# Patient Record
Sex: Female | Born: 2000 | Race: Black or African American | Hispanic: No | Marital: Single | Smoking: Never smoker
Health system: Southern US, Community
[De-identification: ages and names within clinical notes are randomized; demographics above are authoritative.]

## PROBLEM LIST (undated history)

## (undated) ENCOUNTER — Inpatient Hospital Stay (HOSPITAL_COMMUNITY): Payer: Self-pay

## (undated) DIAGNOSIS — J45909 Unspecified asthma, uncomplicated: Secondary | ICD-10-CM

## (undated) DIAGNOSIS — E119 Type 2 diabetes mellitus without complications: Secondary | ICD-10-CM

## (undated) DIAGNOSIS — N83209 Unspecified ovarian cyst, unspecified side: Secondary | ICD-10-CM

## (undated) DIAGNOSIS — E039 Hypothyroidism, unspecified: Secondary | ICD-10-CM

## (undated) HISTORY — PX: CYSTECTOMY: SHX5119

## (undated) HISTORY — PX: APPENDECTOMY: SHX54

## (undated) HISTORY — DX: Unspecified asthma, uncomplicated: J45.909

---
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
## 2020-02-21 ENCOUNTER — Ambulatory Visit: Payer: Medicaid Other | Admitting: Women's Health

## 2020-03-20 ENCOUNTER — Ambulatory Visit: Payer: Medicaid Other | Admitting: Women's Health

## 2020-04-22 ENCOUNTER — Ambulatory Visit: Payer: Medicaid Other | Admitting: Advanced Practice Midwife

## 2020-09-29 IMAGING — CT CT ABD-PELV W/ CM
2 of 4 series · 16 of 46 positions shown, 18 images · IV contrast (omnipaque)
Comparison: CT abdomen pelvis dated 01/10/2016

CLINICAL DATA: Abdominal pain

EXAM:
CT ABDOMEN AND PELVIS WITH CONTRAST
TECHNIQUE: Multidetector CT imaging of the abdomen and pelvis was performed
using the standard protocol following bolus administration of
intravenous contrast.
CONTRAST:  100mL OMNIPAQUE IOHEXOL 300 MG/ML  SOLN

[Series 3: abdomen 5.0 · axial · 0.85mm/px · z∈[+460,+900]mm · 13 of 100 slices shown, 15 images]
[im 6/100  soft-tissue]
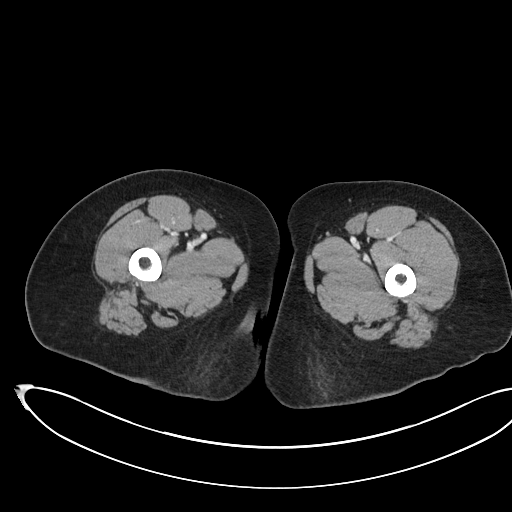
[im 6/100  bone]
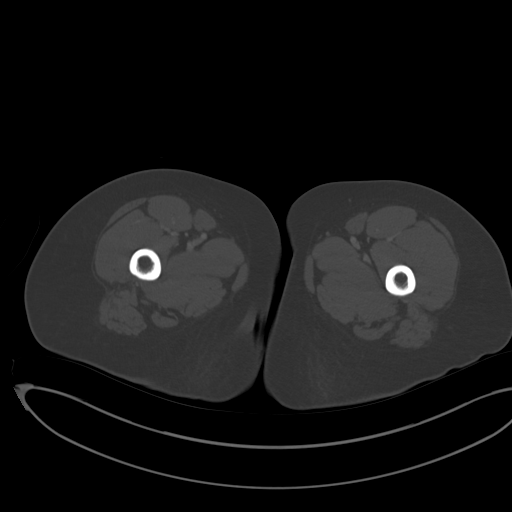
[im 16/100  soft-tissue]
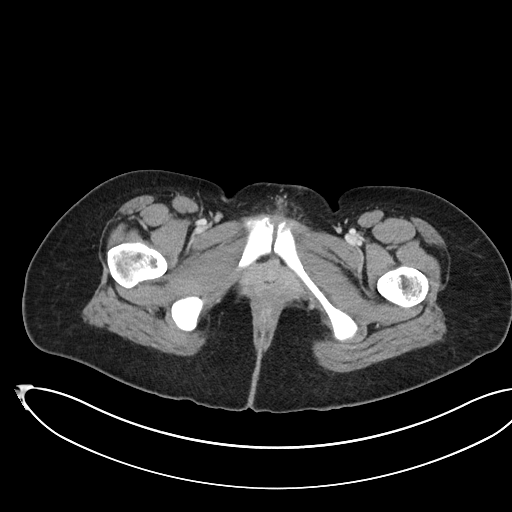
[im 21/100  soft-tissue]
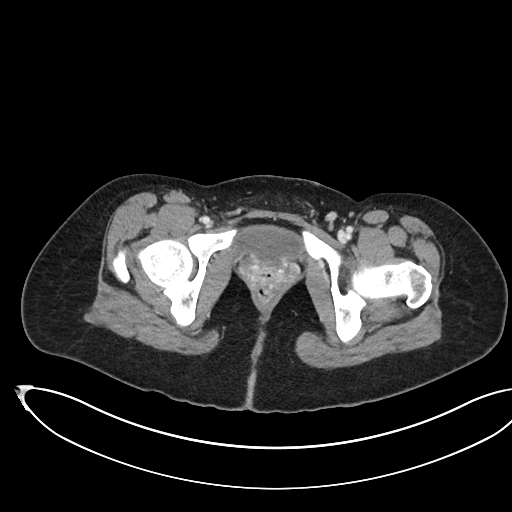
[im 27/100  soft-tissue]
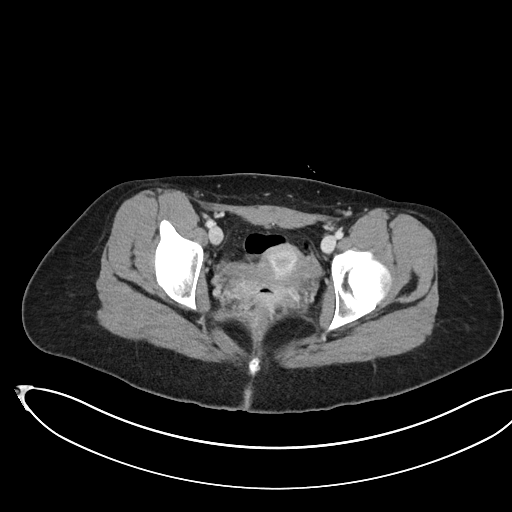
[im 37/100  soft-tissue]
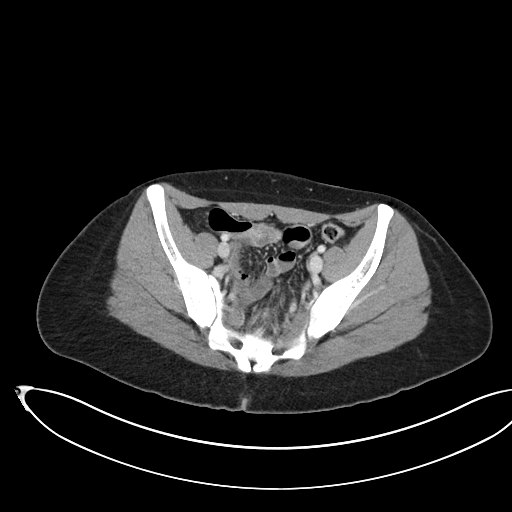
[im 42/100  soft-tissue]
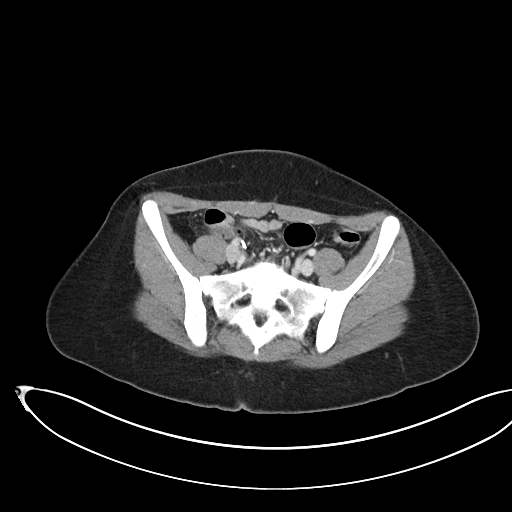
[im 53/100  soft-tissue]
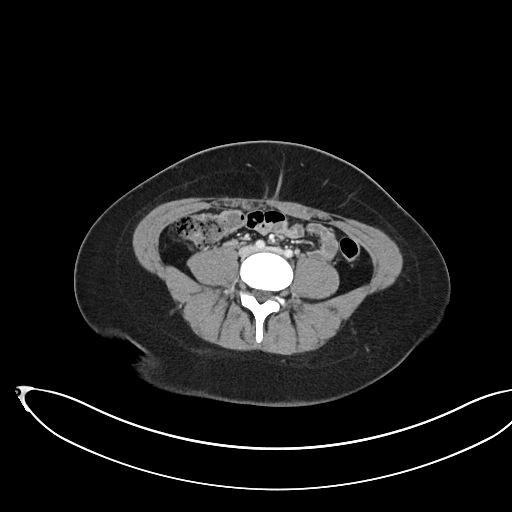
[im 58/100  soft-tissue]
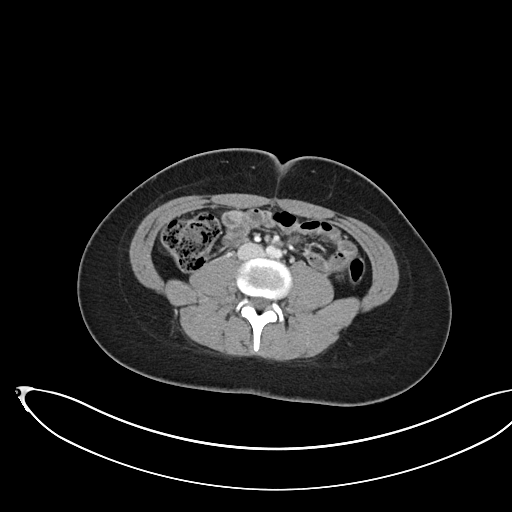
[im 63/100  soft-tissue]
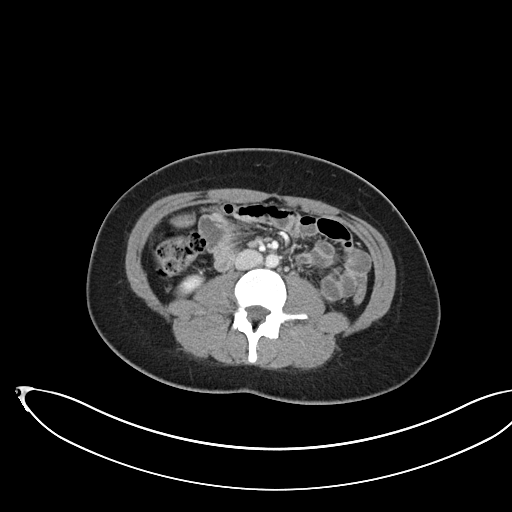
[im 63/100  bone]
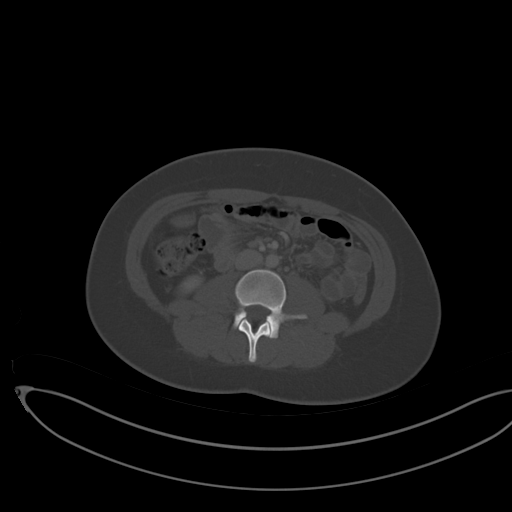
[im 73/100  soft-tissue]
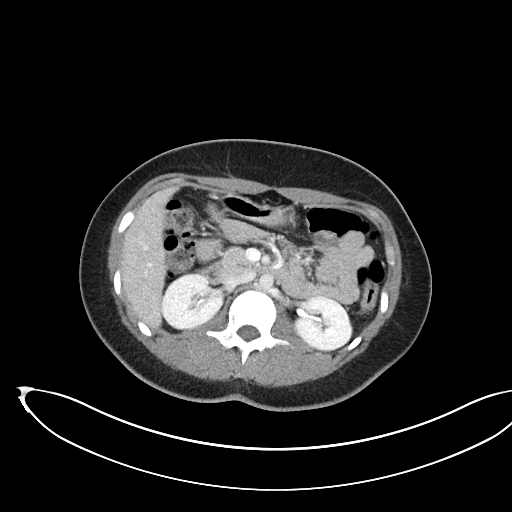
[im 79/100  soft-tissue]
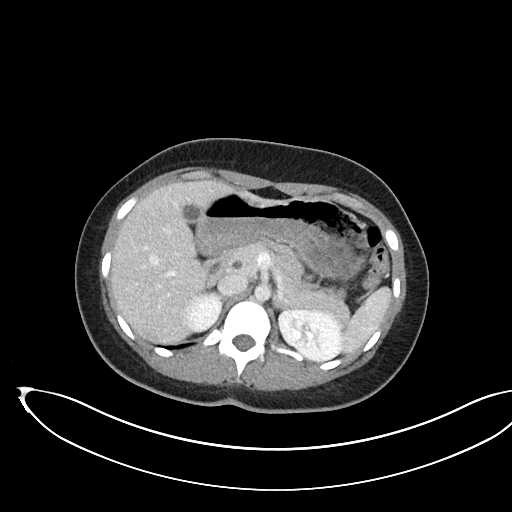
[im 84/100  soft-tissue]
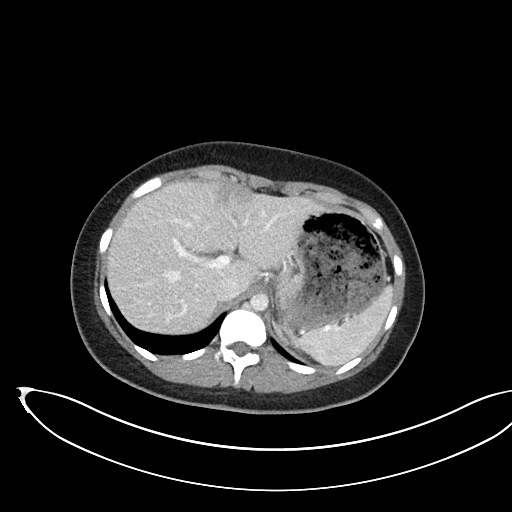
[im 94/100  soft-tissue]
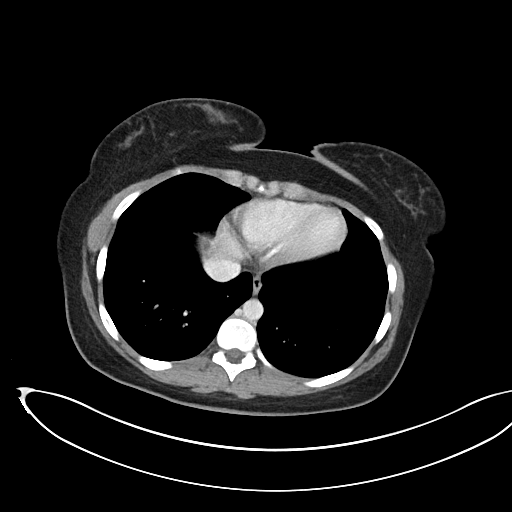

[Series 6: abdomen 3.0 mpr cor · coronal · 0.82mm/px · 3 of 91 slices shown]
[im 41/91  soft-tissue]
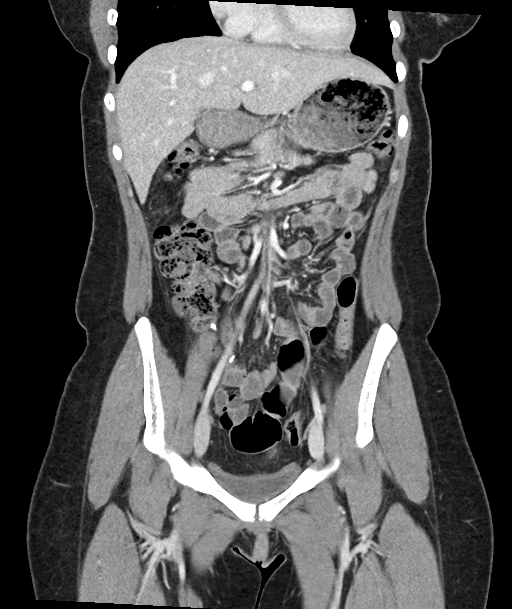
[im 51/91  soft-tissue]
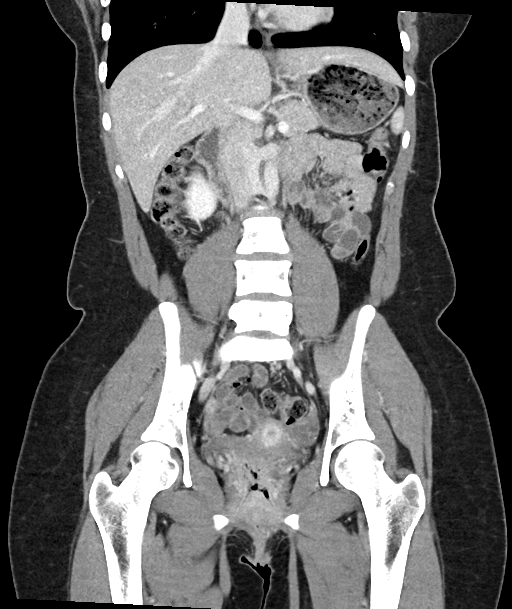
[im 61/91  soft-tissue]
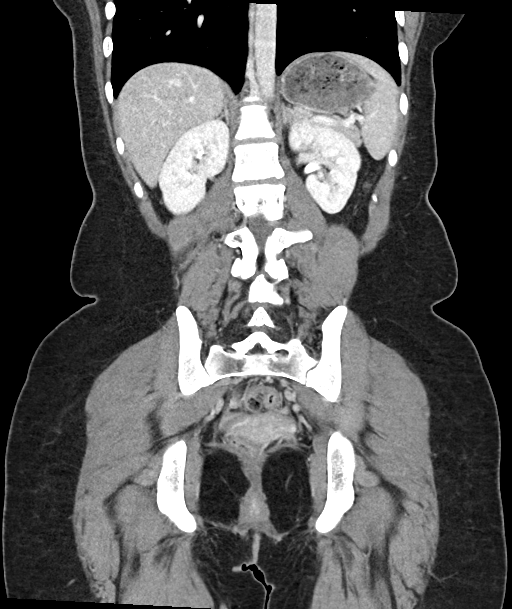

[16 of 46 positions shown; findings below may reference images not displayed]

FINDINGS: Lower chest: No acute abnormality.

Hepatobiliary: Geographic hypoattenuation adjacent to the falciform
ligament likely represents focal fatty infiltration. No gallstones,
gallbladder wall thickening, or biliary dilatation.

Pancreas: Unremarkable. No pancreatic ductal dilatation or
surrounding inflammatory changes.

Spleen: Normal in size without focal abnormality.

Adrenals/Urinary Tract: Adrenal glands are unremarkable. Kidneys are
normal, without renal calculi, focal lesion, or hydronephrosis.
Bladder is incompletely distended.

Stomach/Bowel: Stomach is within normal limits. The appendix is
surgically absent. No evidence of bowel wall thickening, distention,
or inflammatory changes.

Vascular/Lymphatic: A retroaortic left renal vein is noted. No
enlarged abdominal or pelvic lymph nodes.

Reproductive: Uterus and bilateral adnexa are unremarkable.

Other: No abdominal wall hernia or abnormality. No abdominopelvic
ascites.

Musculoskeletal: There is 3 mm retrolisthesis of L5 on S1, not
significantly changed.
IMPRESSION: No acute findings in the abdomen or pelvis.

## 2020-09-29 IMAGING — US US PELVIS COMPLETE TRANSABD/TRANSVAG W DUPLEX
1 series · 14 of 25 positions shown · non-contrast
Comparison: None

CLINICAL DATA: Pelvic pain and abdominal pain



[Series 1: us pelvic complete w transvaginal and torsion righ · 14 of 42 slices shown]
[im 1/42]
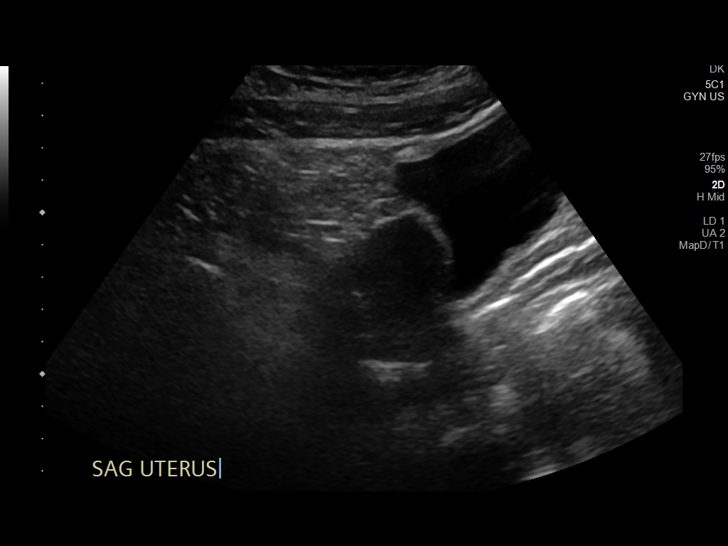
[im 4/42]
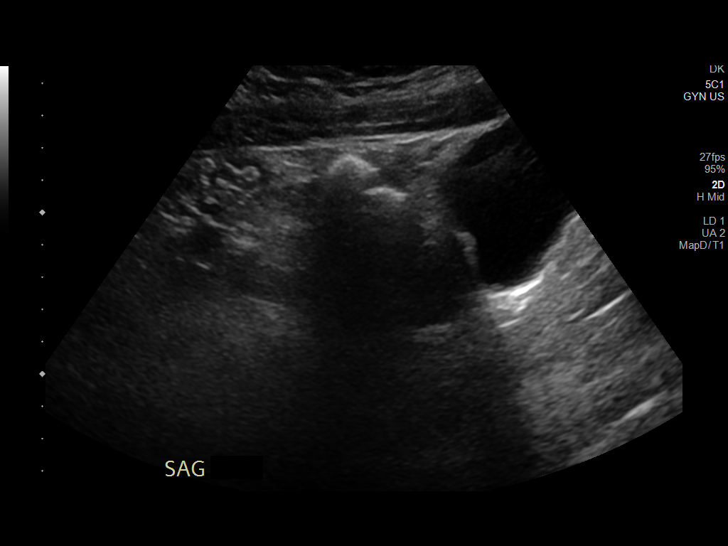
[im 7/42]
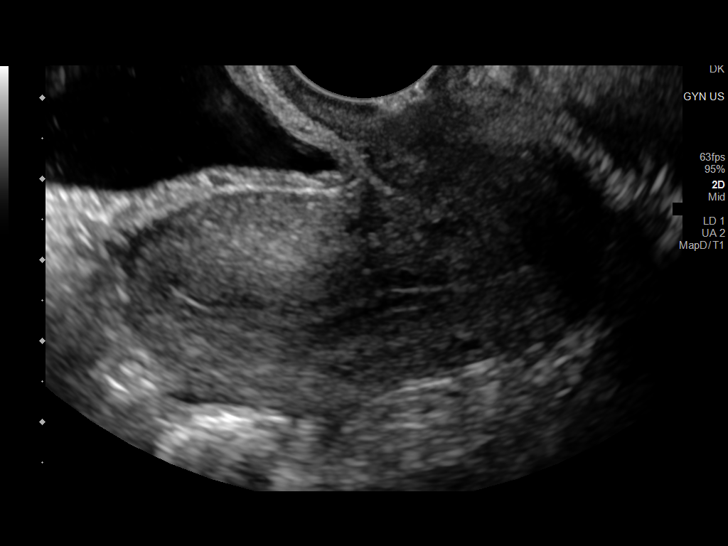
[im 11/42]
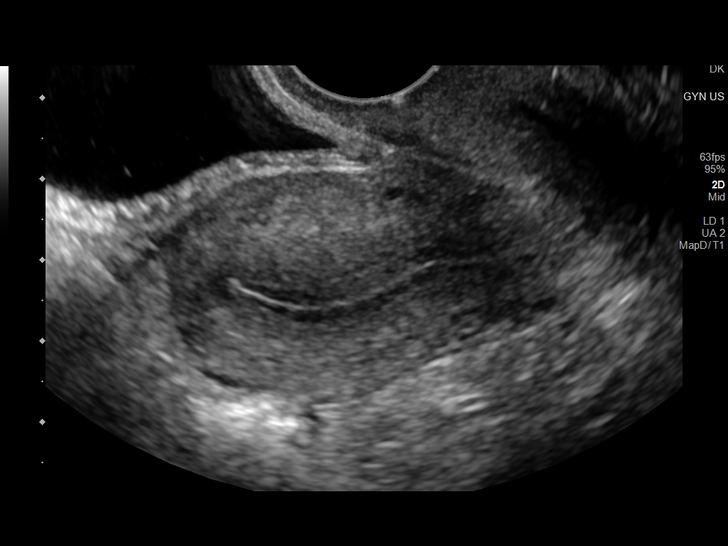
[im 14/42]
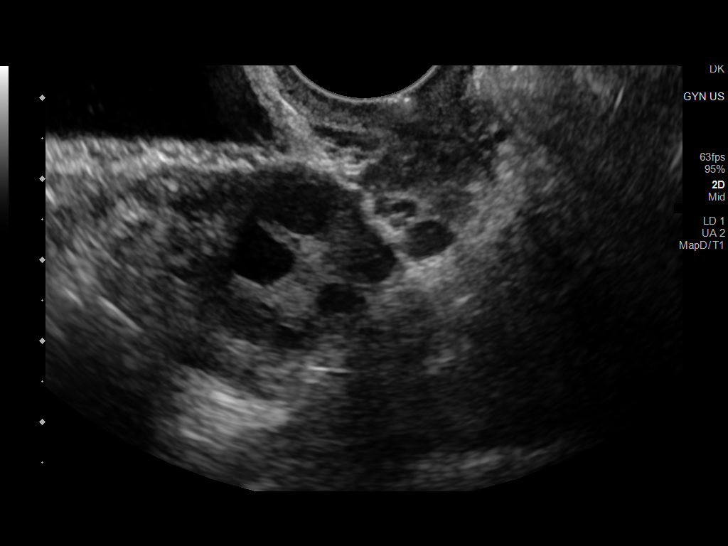
[im 16/42]
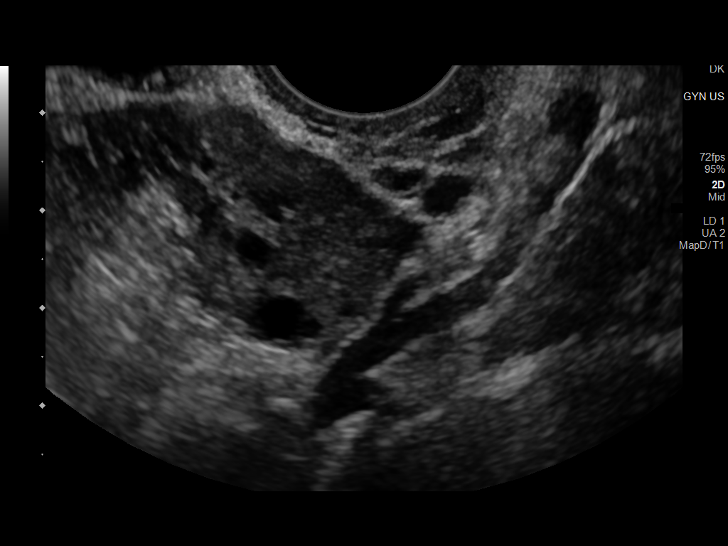
[im 19/42]
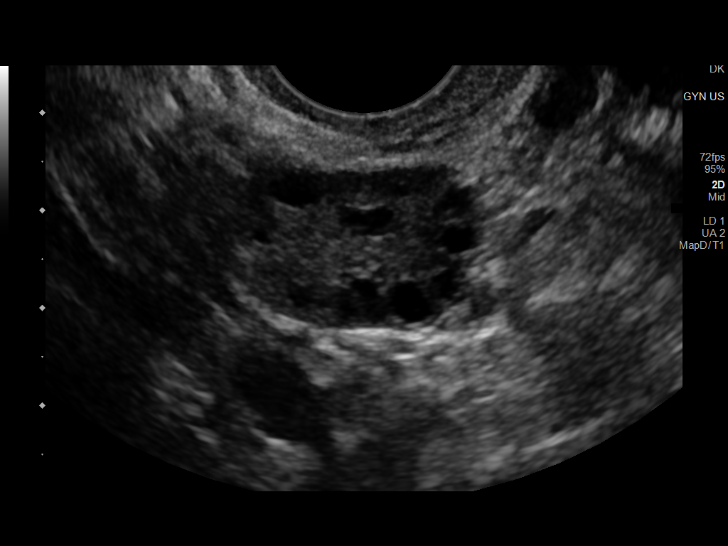
[im 23/42]
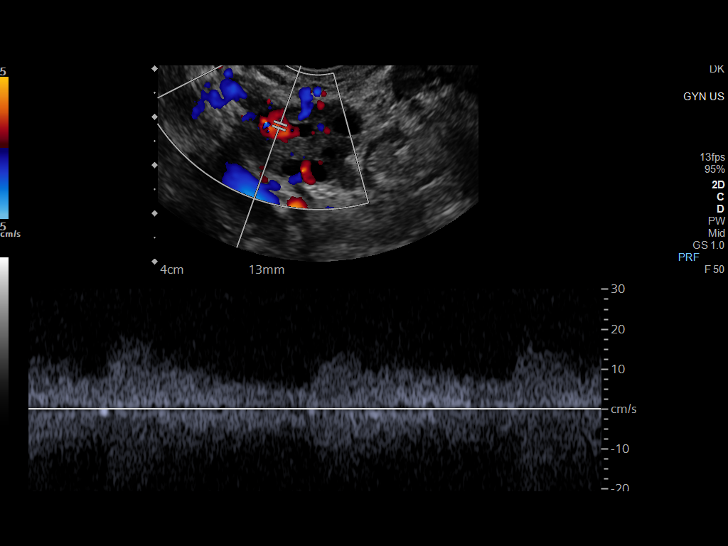
[im 26/42]
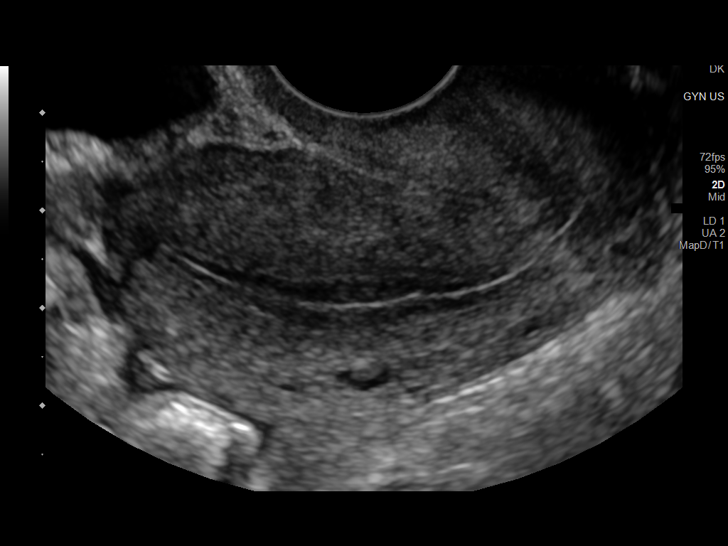
[im 28/42]
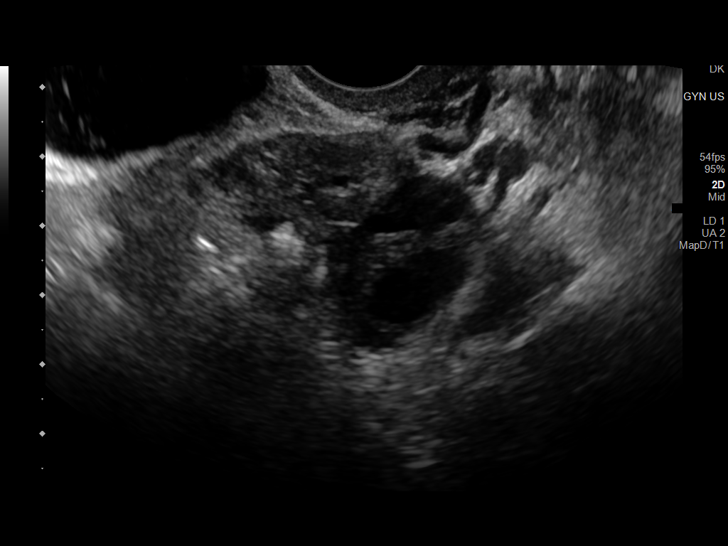
[im 31/42]
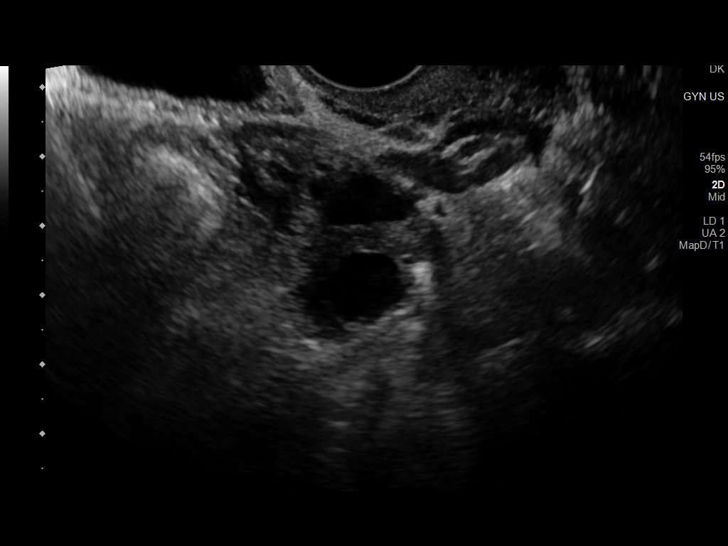
[im 35/42]
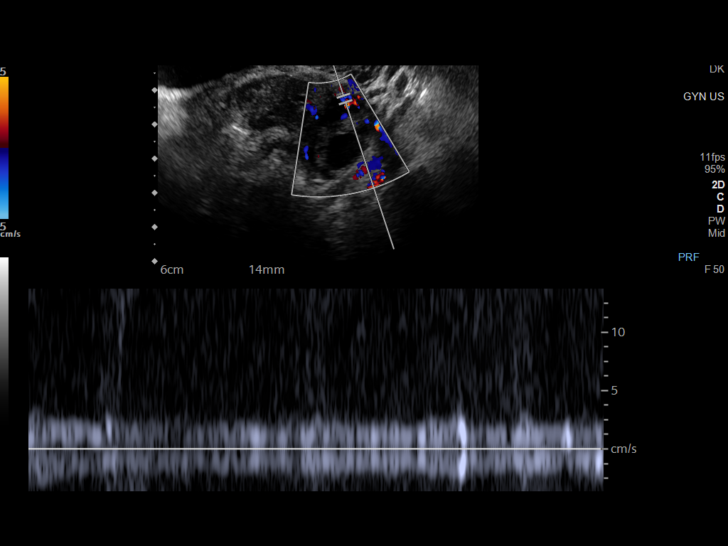
[im 38/42]
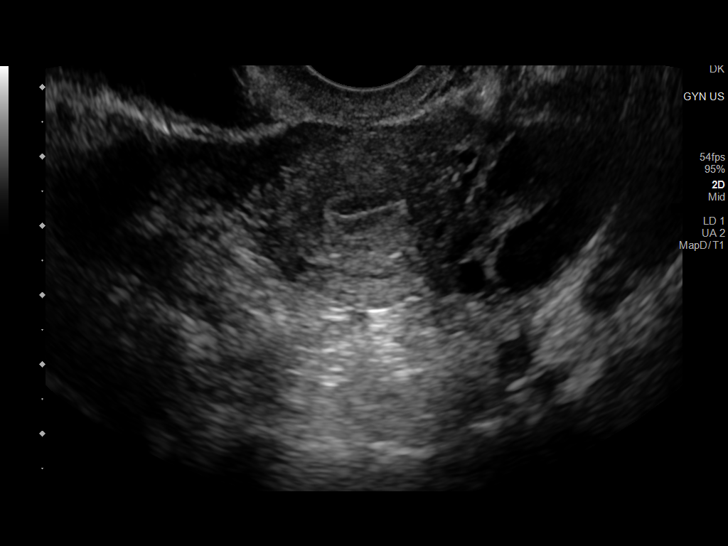
[im 42/42]
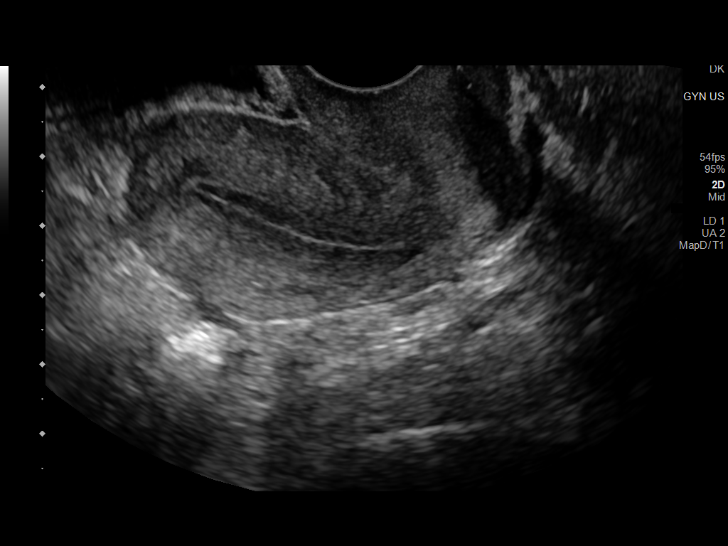

[14 of 25 positions shown; findings below may reference images not displayed]

FINDINGS: Uterus

Measurements: 6.1 x 2.8 x 3.3 cm = volume: 28 mL. No fibroids or
other mass visualized.

Endometrium

Thickness: 5.4 mm.  No focal abnormality visualized.

Right ovary

Measurements: 2.5 x 2.3 x 2.2 cm = volume: 6.45 mL. Normal
appearance/no adnexal mass.

Left ovary

Measurements: 2.8 x 2.1 x 1.7 cm = volume: 5.1 mL. Normal
appearance/no adnexal mass.

Other findings

No abnormal free fluid.
IMPRESSION: Normal appearing uterus and ovaries

## 2021-09-07 ENCOUNTER — Emergency Department (HOSPITAL_COMMUNITY)
Admission: EM | Admit: 2021-09-07 | Discharge: 2021-09-08 | Disposition: A | Discharge status: Home / Self Care | Payer: Medicaid Other | Attending: Emergency Medicine | Admitting: Emergency Medicine

## 2021-09-07 ENCOUNTER — Encounter (HOSPITAL_COMMUNITY): Payer: Self-pay

## 2021-09-07 ENCOUNTER — Other Ambulatory Visit: Payer: Self-pay

## 2021-09-07 DIAGNOSIS — E876 Hypokalemia: Secondary | ICD-10-CM | POA: Insufficient documentation

## 2021-09-07 DIAGNOSIS — Z8616 Personal history of COVID-19: Secondary | ICD-10-CM | POA: Insufficient documentation

## 2021-09-07 DIAGNOSIS — D72829 Elevated white blood cell count, unspecified: Secondary | ICD-10-CM | POA: Insufficient documentation

## 2021-09-07 DIAGNOSIS — R112 Nausea with vomiting, unspecified: Secondary | ICD-10-CM | POA: Diagnosis not present

## 2021-09-07 DIAGNOSIS — R197 Diarrhea, unspecified: Secondary | ICD-10-CM | POA: Insufficient documentation

## 2021-09-07 NOTE — ED Triage Notes (Signed)
Patient was Covid positive on 7/18, has been feeling bad since then. Vomiting, headaches, "noodle legs," diarrhea, congestion.

## 2021-09-08 LAB — URINALYSIS, ROUTINE W REFLEX MICROSCOPIC
Bilirubin Urine: NEGATIVE
Glucose, UA: NEGATIVE mg/dL
Hgb urine dipstick: NEGATIVE
Ketones, ur: 5 mg/dL — AB
Nitrite: NEGATIVE
Protein, ur: 100 mg/dL — AB
Specific Gravity, Urine: 1.035 — ABNORMAL HIGH (ref 1.005–1.030)
pH: 5 (ref 5.0–8.0)

## 2021-09-08 LAB — COMPREHENSIVE METABOLIC PANEL
ALT: 21 U/L (ref 0–44)
AST: 27 U/L (ref 15–41)
Albumin: 4.1 g/dL (ref 3.5–5.0)
Alkaline Phosphatase: 87 U/L (ref 38–126)
Anion gap: 8 (ref 5–15)
BUN: 10 mg/dL (ref 6–20)
CO2: 26 mmol/L (ref 22–32)
Calcium: 9.5 mg/dL (ref 8.9–10.3)
Chloride: 108 mmol/L (ref 98–111)
Creatinine, Ser: 0.85 mg/dL (ref 0.44–1.00)
GFR, Estimated: >60 mL/min (ref >60)
Glucose, Bld: 100 mg/dL — ABNORMAL HIGH (ref 70–99)
Potassium: 2.7 mmol/L — CL (ref 3.5–5.1)
Sodium: 142 mmol/L (ref 135–145)
Total Bilirubin: 0.5 mg/dL (ref 0.3–1.2)
Total Protein: 8.1 g/dL (ref 6.5–8.1)

## 2021-09-08 LAB — I-STAT BETA HCG BLOOD, ED (MC, WL, AP ONLY): I-stat hCG, quantitative: <5 m[IU]/mL (ref <5)

## 2021-09-08 LAB — TSH: TSH: 3.705 u[IU]/mL (ref 0.350–4.500)

## 2021-09-08 LAB — LIPASE, BLOOD: Lipase: 31 U/L (ref 11–51)

## 2021-09-08 LAB — POTASSIUM: Potassium: 4 mmol/L (ref 3.5–5.1)

## 2021-09-08 LAB — CBC
HCT: 39.7 % (ref 36.0–46.0)
Hemoglobin: 13.1 g/dL (ref 12.0–15.0)
MCH: 30.2 pg (ref 26.0–34.0)
MCHC: 33 g/dL (ref 30.0–36.0)
MCV: 91.5 fL (ref 80.0–100.0)
Platelets: 316 10*3/uL (ref 150–400)
RBC: 4.34 MIL/uL (ref 3.87–5.11)
RDW: 14 % (ref 11.5–15.5)
WBC: 12.4 10*3/uL — ABNORMAL HIGH (ref 4.0–10.5)
nRBC: 0 % (ref 0.0–0.2)

## 2021-09-08 LAB — MAGNESIUM: Magnesium: 2 mg/dL (ref 1.7–2.4)

## 2021-09-08 MED ORDER — SODIUM CHLORIDE 0.9 % IV BOLUS
1000.0000 mL | Freq: Once | INTRAVENOUS | Status: AC
  Administered 2021-09-08: 1000 mL via INTRAVENOUS

## 2021-09-08 MED ORDER — MAGNESIUM SULFATE 2 GM/50ML IV SOLN
2.0000 g | INTRAVENOUS | Status: AC
  Administered 2021-09-08: 2 g via INTRAVENOUS
  Filled 2021-09-08: qty 50

## 2021-09-08 MED ORDER — ONDANSETRON 4 MG PO TBDP: 4.0000 mg | ORAL_TABLET | Freq: Three times a day (TID) | ORAL | 0 refills | Status: DC | PRN

## 2021-09-08 MED ORDER — ONDANSETRON HCL 4 MG/2ML IJ SOLN
4.0000 mg | Freq: Once | INTRAMUSCULAR | Status: AC
  Administered 2021-09-08: 4 mg via INTRAVENOUS
  Filled 2021-09-08: qty 2

## 2021-09-08 MED ORDER — ACETAMINOPHEN 325 MG PO TABS
650.0000 mg | ORAL_TABLET | Freq: Once | ORAL | Status: AC
  Administered 2021-09-08: 650 mg via ORAL
  Filled 2021-09-08: qty 2

## 2021-09-08 MED ORDER — POTASSIUM CHLORIDE CRYS ER 20 MEQ PO TBCR
40.0000 meq | EXTENDED_RELEASE_TABLET | Freq: Once | ORAL | Status: AC
  Administered 2021-09-08: 40 meq via ORAL
  Filled 2021-09-08: qty 2

## 2021-09-08 MED ORDER — ACETAMINOPHEN 325 MG PO TABS
650.0000 mg | ORAL_TABLET | Freq: Once | ORAL | Status: AC | PRN
  Administered 2021-09-08: 650 mg via ORAL
  Filled 2021-09-08: qty 2

## 2021-09-08 MED ORDER — POTASSIUM CHLORIDE 10 MEQ/100ML IV SOLN
10.0000 meq | INTRAVENOUS | Status: AC
  Administered 2021-09-08 (×4): 10 meq via INTRAVENOUS
  Filled 2021-09-08 (×4): qty 100

## 2021-09-08 NOTE — Discharge Instructions (Addendum)
Your potassium level is now within normal limits.  I am discharging you with some medication to treat your nausea.  Continue frequent small sips (10-20 ml) of clear liquids every 5-10 minutes. Gatorade or powerade are good options. Avoid milk, orange juice, and grape juice for now. . Once you have not had further vomiting with the small sips for 4 hours, you may begin to drink larger volumes of fluids at a time and try a bland diet which may include saltine crackers, applesauce, breads, pastas, bananas, bland chicken. If you continues to vomit despite medication, return to the ED for repeat evaluation. Otherwise, follow up with your doctor in 2-3 days for a re-check.

## 2021-09-08 NOTE — ED Provider Notes (Signed)
[facility]-EMERGENCY DEPT Provider Note   CSN: [id number] Arrival date & time: 09/07/21  2143     History  Chief Complaint  Patient presents with   Generalized Body Aches   Emesis    Kelsey Jenkins is a 21 y.o. female who presents emergency department with chief complaint of nausea vomiting and diarrhea.  Patient states that she had a fever and body aches tested positive for COVID at home on 18 August.  Since that time she has had abdominal cramping, nausea vomiting and diarrhea.  She states that she has been having difficulty holding down foods and fluids.  She states that she feels extremely weak and her legs feel like "noodles."  She has no abdominal pain at this time and no hx of hypokalemia.   Emesis      Home Medications Prior to Admission medications   Medication Sig Start Date End Date Taking? Authorizing Provider  acetaminophen (TYLENOL) 325 MG tablet Take 2 tablets (650 mg total) by mouth every 6 (six) hours as needed for mild pain or moderate pain. Patient not taking: Reported on 07/11/2018 03/11/17   Sherrilee Gilles, NP  ibuprofen (ADVIL,MOTRIN) 600 MG tablet Take 1 tablet (600 mg total) by mouth every 6 (six) hours as needed for mild pain or moderate pain. Patient not taking: Reported on 07/11/2018 03/11/17   Sherrilee Gilles, NP  levothyroxine (SYNTHROID) 88 MCG tablet Take 1 tablet (88 mcg total) by mouth every morning. 09/07/18   Gretchen Short, NP  metFORMIN (GLUCOPHAGE) 500 MG tablet Take 1 tablet (500 mg total) by mouth 2 (two) times daily with a meal. 09/07/18   Gretchen Short, NP  naproxen (NAPROSYN) 500 MG tablet Take 1 tablet (500 mg total) by mouth 2 (two) times daily. Patient not taking: Reported on 10/09/2016 07/01/16   Phillis Haggis, MD  ranitidine (ZANTAC) 150 MG tablet take 1 tablet by mouth twice a day Patient not taking: Reported on 07/11/2018 07/07/16   David Stall, MD      Allergies    Penicillins    Review of  Systems   Review of Systems  Gastrointestinal:  Positive for vomiting.    Physical Exam Updated Vital Signs BP 113/72   Pulse (!) 56   Temp 97.7 F (36.5 C) (Oral)   Resp 17   Ht 5\' 3"  (1.6 m)   Wt 72.6 kg   SpO2 99%   BMI 28.34 kg/m  Physical Exam Vitals and nursing note reviewed.  Constitutional:      General: She is not in acute distress.    Appearance: She is well-developed. She is not diaphoretic.  HENT:     Head: Normocephalic and atraumatic.     Right Ear: External ear normal.     Left Ear: External ear normal.     Nose: Nose normal.     Mouth/Throat:     Mouth: Mucous membranes are moist.  Eyes:     General: No scleral icterus.    Conjunctiva/sclera: Conjunctivae normal.  Cardiovascular:     Rate and Rhythm: Normal rate and regular rhythm.     Heart sounds: Normal heart sounds. No murmur heard.    No friction rub. No gallop.  Pulmonary:     Effort: Pulmonary effort is normal. No respiratory distress.     Breath sounds: Normal breath sounds.  Abdominal:     General: Bowel sounds are normal. There is no distension.     Palpations: Abdomen is soft. There  is no mass.     Tenderness: There is no abdominal tenderness. There is no guarding.  Musculoskeletal:     Cervical back: Normal range of motion.  Skin:    General: Skin is warm and dry.  Neurological:     Mental Status: She is alert and oriented to person, place, and time.  Psychiatric:        Behavior: Behavior normal.     ED Results / Procedures / Treatments   Labs (all labs ordered are listed, but only abnormal results are displayed) Labs Reviewed  COMPREHENSIVE METABOLIC PANEL - Abnormal; Notable for the following components:      Result Value   Potassium 2.7 (*)    Glucose, Bld 100 (*)    All other components within normal limits  CBC - Abnormal; Notable for the following components:   WBC 12.4 (*)    All other components within normal limits  URINALYSIS, ROUTINE W REFLEX MICROSCOPIC -  Abnormal; Notable for the following components:   Color, Urine AMBER (*)    APPearance CLOUDY (*)    Specific Gravity, Urine 1.035 (*)    Ketones, ur 5 (*)    Protein, ur 100 (*)    Leukocytes,Ua TRACE (*)    Bacteria, UA RARE (*)    All other components within normal limits  LIPASE, BLOOD  TSH  MAGNESIUM  I-STAT BETA HCG BLOOD, ED ([facility], [facility], [facility] ONLY)    EKG EKG Interpretation  Date/Time:  Monday September 08 2021 01:44:53 EDT Ventricular Rate:  59 PR Interval:  57 QRS Duration: 86 QT Interval:  404 QTC Calculation: 401 R Axis:   68 Text Interpretation: Unknown rhythm, irregular rate Short PR interval Borderline T wave abnormalities Confirmed by Tilden Fossa [phone]) on 09/08/2021 1:49:57 AM  Radiology No results found.  Procedures .Critical Care  Performed by: Arthor Captain, PA-C Authorized by: Arthor Captain, PA-C   Critical care provider statement:    Critical care time (minutes):  60   Critical care time was exclusive of:  Separately billable procedures and treating other patients   Critical care was necessary to treat or prevent imminent or life-threatening deterioration of the following conditions:  Metabolic crisis   Critical care was time spent personally by me on the following activities:  Development of treatment plan with patient or surrogate, discussions with consultants, evaluation of patient's response to treatment, examination of patient, ordering and review of laboratory studies, ordering and review of radiographic studies, ordering and performing treatments and interventions, pulse oximetry, re-evaluation of patient's condition and review of old charts   Medications Ordered in ED Medications  magnesium sulfate IVPB 2 g 50 mL (has no administration in time range)  potassium chloride 10 mEq in 100 mL IVPB (has no administration in time range)  potassium chloride SA (KLOR-CON M) CR tablet 40 mEq (has no administration in time range)  ondansetron (ZOFRAN)  injection 4 mg (has no administration in time range)  acetaminophen (TYLENOL) tablet 650 mg (650 mg Oral Given 09/08/21 0005)    ED Course/ Medical Decision Making/ A&P Clinical Course as of 09/08/21 1540  Mon Sep 08, 2021  1212 Urinalysis, Routine w reflex microscopic(!) [AH]  1212 CBC(!) [AH]  1212 Potassium(!!): 2.7 [AH]  1213 Lipase, blood [AH]  1213 TSH [AH]  1213 Magnesium [AH]    Clinical Course User Index [AH] Arthor Captain, PA-C  Medical Decision Making Patient here with weakness after several days of nausea vomiting and diarrhea The differential diagnosis of weakness includes but is not limited to neurologic causes (GBS, myasthenia gravis, CVA, MS, ALS, transverse myelitis, spinal cord injury, CVA, botulism, ) and other causes: ACS, Arrhythmia, syncope, orthostatic hypotension, sepsis, hypoglycemia, electrolyte disturbance, hypothyroidism, respiratory failure, symptomatic anemia, dehydration, heat injury, polypharmacy, malignancy.  I reviewed the patient's labs which shows potassium of 2.7. Patient spent 4 hours here with both oral and IV runs of potassium.  Potassium rechecked and now normalized. The remainder of the patient's labs are insignificant.  She has a mildly elevated leukocytosis.  Urine appears contaminated but she denies urinary symptoms.  Patient had 1 episode of vomiting here however is tolerating fluids and will be discharged on Zofran.  Amount and/or Complexity of Data Reviewed Labs: ordered. Decision-making details documented in ED Course.    Details: As discussed and medical decision making portion  Risk OTC drugs. Prescription drug management.       Final Clinical Impression(s) / ED Diagnoses Final diagnoses:  Nausea vomiting and diarrhea  Acute hypokalemia    Rx / DC Orders ED Discharge Orders     None         Margarita Mail, PA-C 09/08/21 1557    Quintella Reichert, MD 09/09/21 [phone]

## 2021-12-25 DIAGNOSIS — M67432 Ganglion, left wrist: Secondary | ICD-10-CM | POA: Diagnosis not present

## 2021-12-25 DIAGNOSIS — M25562 Pain in left knee: Secondary | ICD-10-CM | POA: Diagnosis not present

## 2022-02-14 DIAGNOSIS — R051 Acute cough: Secondary | ICD-10-CM | POA: Diagnosis not present

## 2022-02-14 DIAGNOSIS — J069 Acute upper respiratory infection, unspecified: Secondary | ICD-10-CM | POA: Diagnosis not present

## 2022-02-14 DIAGNOSIS — R519 Headache, unspecified: Secondary | ICD-10-CM | POA: Diagnosis not present

## 2022-02-14 DIAGNOSIS — J039 Acute tonsillitis, unspecified: Secondary | ICD-10-CM | POA: Diagnosis not present

## 2022-07-09 DIAGNOSIS — A084 Viral intestinal infection, unspecified: Secondary | ICD-10-CM | POA: Diagnosis not present

## 2022-07-09 DIAGNOSIS — J4530 Mild persistent asthma, uncomplicated: Secondary | ICD-10-CM | POA: Diagnosis not present

## 2022-07-09 DIAGNOSIS — R350 Frequency of micturition: Secondary | ICD-10-CM | POA: Diagnosis not present

## 2022-07-09 DIAGNOSIS — R12 Heartburn: Secondary | ICD-10-CM | POA: Diagnosis not present

## 2022-07-09 DIAGNOSIS — Z3201 Encounter for pregnancy test, result positive: Secondary | ICD-10-CM | POA: Diagnosis not present

## 2022-07-23 DIAGNOSIS — Z1329 Encounter for screening for other suspected endocrine disorder: Secondary | ICD-10-CM | POA: Diagnosis not present

## 2022-07-23 DIAGNOSIS — Z114 Encounter for screening for human immunodeficiency virus [HIV]: Secondary | ICD-10-CM | POA: Diagnosis not present

## 2022-07-23 DIAGNOSIS — Z3201 Encounter for pregnancy test, result positive: Secondary | ICD-10-CM | POA: Diagnosis not present

## 2022-07-23 DIAGNOSIS — F411 Generalized anxiety disorder: Secondary | ICD-10-CM | POA: Diagnosis not present

## 2022-07-23 DIAGNOSIS — Z1159 Encounter for screening for other viral diseases: Secondary | ICD-10-CM | POA: Diagnosis not present

## 2022-07-23 DIAGNOSIS — E063 Autoimmune thyroiditis: Secondary | ICD-10-CM | POA: Diagnosis not present

## 2022-07-23 DIAGNOSIS — Z113 Encounter for screening for infections with a predominantly sexual mode of transmission: Secondary | ICD-10-CM | POA: Diagnosis not present

## 2022-07-23 DIAGNOSIS — Z131 Encounter for screening for diabetes mellitus: Secondary | ICD-10-CM | POA: Diagnosis not present

## 2022-07-23 DIAGNOSIS — R7303 Prediabetes: Secondary | ICD-10-CM | POA: Diagnosis not present

## 2022-08-13 DIAGNOSIS — M25562 Pain in left knee: Secondary | ICD-10-CM | POA: Diagnosis not present

## 2022-08-13 DIAGNOSIS — M25512 Pain in left shoulder: Secondary | ICD-10-CM | POA: Diagnosis not present

## 2022-08-13 DIAGNOSIS — S46812A Strain of other muscles, fascia and tendons at shoulder and upper arm level, left arm, initial encounter: Secondary | ICD-10-CM | POA: Diagnosis not present

## 2022-08-13 DIAGNOSIS — M542 Cervicalgia: Secondary | ICD-10-CM | POA: Diagnosis not present

## 2022-08-13 DIAGNOSIS — Z331 Pregnant state, incidental: Secondary | ICD-10-CM | POA: Diagnosis not present

## 2022-08-13 DIAGNOSIS — S8002XA Contusion of left knee, initial encounter: Secondary | ICD-10-CM | POA: Diagnosis not present

## 2022-08-13 DIAGNOSIS — S161XXA Strain of muscle, fascia and tendon at neck level, initial encounter: Secondary | ICD-10-CM | POA: Diagnosis not present

## 2022-08-14 ENCOUNTER — Encounter (HOSPITAL_COMMUNITY): Payer: Self-pay

## 2022-08-14 ENCOUNTER — Emergency Department (HOSPITAL_COMMUNITY)
Admission: EM | Admit: 2022-08-14 | Discharge: 2022-08-14 | Disposition: A | Discharge status: Home / Self Care | Payer: Medicaid Other | Attending: Emergency Medicine | Admitting: Emergency Medicine

## 2022-08-14 ENCOUNTER — Emergency Department (HOSPITAL_COMMUNITY): Payer: Medicaid Other

## 2022-08-14 ENCOUNTER — Other Ambulatory Visit: Payer: Self-pay

## 2022-08-14 DIAGNOSIS — R1030 Lower abdominal pain, unspecified: Secondary | ICD-10-CM | POA: Insufficient documentation

## 2022-08-14 DIAGNOSIS — M542 Cervicalgia: Secondary | ICD-10-CM | POA: Diagnosis not present

## 2022-08-14 DIAGNOSIS — O9A211 Injury, poisoning and certain other consequences of external causes complicating pregnancy, first trimester: Secondary | ICD-10-CM | POA: Insufficient documentation

## 2022-08-14 DIAGNOSIS — O0991 Supervision of high risk pregnancy, unspecified, first trimester: Secondary | ICD-10-CM | POA: Insufficient documentation

## 2022-08-14 DIAGNOSIS — O99891 Other specified diseases and conditions complicating pregnancy: Secondary | ICD-10-CM | POA: Diagnosis not present

## 2022-08-14 DIAGNOSIS — M25562 Pain in left knee: Secondary | ICD-10-CM | POA: Insufficient documentation

## 2022-08-14 DIAGNOSIS — Y92410 Unspecified street and highway as the place of occurrence of the external cause: Secondary | ICD-10-CM | POA: Insufficient documentation

## 2022-08-14 DIAGNOSIS — Z3A09 9 weeks gestation of pregnancy: Secondary | ICD-10-CM | POA: Diagnosis not present

## 2022-08-14 DIAGNOSIS — Z3A01 Less than 8 weeks gestation of pregnancy: Secondary | ICD-10-CM | POA: Diagnosis not present

## 2022-08-14 DIAGNOSIS — O26891 Other specified pregnancy related conditions, first trimester: Secondary | ICD-10-CM | POA: Diagnosis not present

## 2022-08-14 LAB — PREGNANCY, URINE: Preg Test, Ur: POSITIVE — AB

## 2022-08-14 LAB — HCG, QUANTITATIVE, PREGNANCY: hCG, Beta Chain, Quant, S: 82157 m[IU]/mL — ABNORMAL HIGH
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
## 2022-10-06 ENCOUNTER — Other Ambulatory Visit: Payer: Self-pay

## 2022-10-06 ENCOUNTER — Ambulatory Visit (INDEPENDENT_AMBULATORY_CARE_PROVIDER_SITE_OTHER): Payer: Medicaid Other | Admitting: Obstetrics and Gynecology

## 2022-10-06 ENCOUNTER — Encounter: Payer: Self-pay | Admitting: Obstetrics and Gynecology

## 2022-10-06 ENCOUNTER — Other Ambulatory Visit (HOSPITAL_COMMUNITY)
Admission: RE | Admit: 2022-10-06 | Discharge: 2022-10-06 | Disposition: A | Discharge status: Home / Self Care | Payer: Medicaid Other | Source: Ambulatory Visit | Attending: Obstetrics and Gynecology | Admitting: Obstetrics and Gynecology

## 2022-10-06 VITALS — BP 96/57 | HR 68 | Wt 141.0 lb

## 2022-10-06 DIAGNOSIS — R7303 Prediabetes: Secondary | ICD-10-CM | POA: Diagnosis not present

## 2022-10-06 DIAGNOSIS — E063 Autoimmune thyroiditis: Secondary | ICD-10-CM | POA: Diagnosis not present

## 2022-10-06 DIAGNOSIS — Z3A17 17 weeks gestation of pregnancy: Secondary | ICD-10-CM

## 2022-10-06 DIAGNOSIS — Z3400 Encounter for supervision of normal first pregnancy, unspecified trimester: Secondary | ICD-10-CM

## 2022-10-06 DIAGNOSIS — Z3402 Encounter for supervision of normal first pregnancy, second trimester: Secondary | ICD-10-CM

## 2022-10-06 MED ORDER — FAMOTIDINE 20 MG PO TABS: 20.0000 mg | ORAL_TABLET | Freq: Every day | ORAL | 3 refills | Status: DC

## 2022-10-06 MED ORDER — BLOOD PRESSURE KIT DEVI: 1.0000 | 0 refills | Status: DC

## 2022-10-06 MED ORDER — DOXYLAMINE SUCCINATE (SLEEP) 25 MG PO TABS: 25.0000 mg | ORAL_TABLET | Freq: Every day | ORAL | 2 refills | Status: DC

## 2022-10-06 MED ORDER — GOJJI WEIGHT SCALE MISC: 1.0000 | 0 refills | Status: DC

## 2022-10-06 MED ORDER — VITAMIN B-6 25 MG PO TABS: 25.0000 mg | ORAL_TABLET | Freq: Three times a day (TID) | ORAL | 2 refills | Status: DC

## 2022-10-06 MED ORDER — ASPIRIN 81 MG PO TBEC: 81.0000 mg | DELAYED_RELEASE_TABLET | Freq: Every day | ORAL | 2 refills | Status: DC

## 2022-10-06 NOTE — Progress Notes (Signed)
Subjective:   Kelsey Jenkins is a 22 y.o. G1P0 at [redacted]w[redacted]d by LMP c/w 9wk [place] being seen today for her first obstetrical visit.  Her history is notable for hypothyroidism, prediabetes and asthma. Patient does intend to breast feed. Pregnancy history fully reviewed.  Patient reports  nausea in the AM and occasional vomiting after identifiable triggers .  HISTORY: OB History  Gravida Para Term Preterm AB Living  1 0 0 0 0 0  SAB IAB Ectopic Multiple Live Births  0 0 0 0 0    # Outcome Date GA Lbr Len/2nd Weight Sex Type Anes PTL Lv  1 Current              Last pap smear: never  Past Medical History:  Diagnosis Date   Asthma 2003   Diabetes mellitus without complication (HCC)    Hypoactive thyroid    Past Surgical History:  Procedure Laterality Date   LAPAROSCOPIC APPENDECTOMY N/A 01/10/2016   Procedure: APPENDECTOMY LAPAROSCOPIC;  Surgeon: Leonia Corona, MD;  Location: [facility] OR;  Service: General;  Laterality: N/A;   Family History  Problem Relation Age of Onset   Diabetes Mother    Obesity Mother    Thyroid disease Mother    Ovarian cysts Mother    Diabetes Maternal Grandmother    Hypertension Maternal Grandmother    Thyroid disease Maternal Grandmother    Hypertension Maternal Grandfather    Social History   Tobacco Use   Smoking status: Passive Smoke Exposure - Never Smoker   Smokeless tobacco: Never  Substance Use Topics   Alcohol use: No   Allergies  Allergen Reactions   Penicillins Hives   Current Outpatient Medications on File Prior to Visit  Medication Sig Dispense Refill   acetaminophen (TYLENOL) 500 MG tablet Take 1,000 mg by mouth every 6 (six) hours as needed for headache (pain).     loratadine (CLARITIN) 10 MG tablet Take 10 mg by mouth every evening.     ondansetron (ZOFRAN-ODT) 4 MG disintegrating tablet Take 1 tablet (4 mg total) by mouth every 8 (eight) hours as needed for nausea. 12 tablet 0   albuterol (VENTOLIN HFA) 108 (90 Base) MCG/ACT  inhaler Inhale 2 puffs into the lungs every 6 (six) hours as needed for wheezing or shortness of breath.     No current facility-administered medications on file prior to visit.   Exam   Vitals:   10/06/22 0951  BP: (!) 96/57  Pulse: 68  Weight: 141 lb (64 kg)   Fetal Heart Rate (bpm): 151  General:  Alert, oriented and cooperative. Patient is in no acute distress.  Breast: Deferred  Cardiovascular: Normal heart rate noted  Respiratory: Normal respiratory effort, no problems with respiration noted  Abdomen: Soft, gravid, appropriate for gestational age.  Pain/Pressure: Absent     Pelvic: NEFG. Cervix visually normal and closed. Exam performed in presence of chaperone  Extremities: Normal range of motion.  Edema: None  Mental Status: Normal mood and affect. Normal behavior. Normal judgment and thought content.    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 Supervision of normal first pregnancy, antepartum [Z34.00] 4. 17 weeks Initial labs drawn. Continue prenatal vitamins.  Discussed ldASA for preE prevention, pt accepts Genetic Screening discussed: NIPS, carrier screening and AFP, ordered. Ultrasound discussed; fetal anatomic  survey: ordered. Problem list reviewed and updated. The nature of [facility] with multiple MDs and other Advanced Practice Providers was explained to patient; also emphasized that residents, students are part of our team. Routine obstetric precautions reviewed. - Cytology - PAP( [place]) - Cervicovaginal ancillary only( ) - CBC/D/Plt+RPR+Rh+ABO+RubIgG... - Culture, OB Urine - HgB A1c - Ambulatory referral to [facility] - [place] MFM OB COMP + 14 WK; Future - Comp Met ([facility]) - TSH Rfx on Abnormal to Free  T4 - AFP, Serum, Open Spina Bifida - PANORAMA PRENATAL TEST - [facility] Basic Panel  2. Pre-diabetes No current medications, previously on metformin Last A1c 5.5 01/2020 - HgB A1c  3. Hypothyroidism, acquired, autoimmune No current medications, previously on synthroid daily Last TSH 6/13 2.13, fT4 1.11 - TSH Rfx on Abnormal to Free T4  5. Nausea/vomiting of pregnancy Discussed B6/unisom and pepcid w/ zofran prn   Return in about 4 weeks (around 11/03/2022) for return OB at 22 weeks.  Harvie Bridge, MD Obstetrician & Gynecologist, [facility] for [employer], [facility]

## 2022-10-06 NOTE — Progress Notes (Addendum)
NOB, c/o [place] the Zofran does not work.  Pt stated that her PCP stopped her Metformin and Thyroid Rx because her numbers were normal.

## 2022-10-07 LAB — CBC/D/PLT+RPR+RH+ABO+RUBIGG...
Antibody Screen: NEGATIVE
Basophils Absolute: 0 10*3/uL (ref 0.0–0.2)
Basos: 0 %
EOS (ABSOLUTE): 0.2 10*3/uL (ref 0.0–0.4)
Eos: 2 %
HCV Ab: NONREACTIVE
HIV Screen 4th Generation wRfx: NONREACTIVE
Hematocrit: 32.2 % — ABNORMAL LOW (ref 34.0–46.6)
Hemoglobin: 10.9 g/dL — ABNORMAL LOW (ref 11.1–15.9)
Hepatitis B Surface Ag: NEGATIVE
Immature Grans (Abs): 0 10*3/uL (ref 0.0–0.1)
Immature Granulocytes: 0 %
Lymphocytes Absolute: 1.8 10*3/uL (ref 0.7–3.1)
Lymphs: 21 %
MCH: 31.2 pg (ref 26.6–33.0)
MCHC: 33.9 g/dL (ref 31.5–35.7)
MCV: 92 fL (ref 79–97)
Monocytes Absolute: 0.6 10*3/uL (ref 0.1–0.9)
Monocytes: 7 %
Neutrophils Absolute: 6.2 10*3/uL (ref 1.4–7.0)
Neutrophils: 70 %
Platelets: 255 10*3/uL (ref 150–450)
RBC: 3.49 x10E6/uL — ABNORMAL LOW (ref 3.77–5.28)
RDW: 12.8 % (ref 11.7–15.4)
RPR Ser Ql: NONREACTIVE
Rh Factor: POSITIVE
Rubella Antibodies, IGG: 2.06 {index} (ref >0.99)
WBC: 8.8 10*3/uL (ref 3.4–10.8)

## 2022-10-07 LAB — CERVICOVAGINAL ANCILLARY ONLY
Chlamydia: NEGATIVE
Comment: NEGATIVE
Comment: NEGATIVE
Comment: NORMAL
Neisseria Gonorrhea: NEGATIVE
Trichomonas: NEGATIVE

## 2022-10-07 LAB — COMPREHENSIVE METABOLIC PANEL
ALT: 16 IU/L (ref 0–32)
AST: 18 IU/L (ref 0–40)
Albumin: 3.6 g/dL — ABNORMAL LOW (ref 4.0–5.0)
Alkaline Phosphatase: 88 IU/L (ref 44–121)
BUN/Creatinine Ratio: 14 (ref 9–23)
BUN: 7 mg/dL (ref 6–20)
Bilirubin Total: <0.2 mg/dL (ref 0.0–1.2)
CO2: 20 mmol/L (ref 20–29)
Calcium: 8.8 mg/dL (ref 8.7–10.2)
Chloride: 104 mmol/L (ref 96–106)
Creatinine, Ser: 0.49 mg/dL — ABNORMAL LOW (ref 0.57–1.00)
Globulin, Total: 2.7 g/dL (ref 1.5–4.5)
Glucose: 76 mg/dL (ref 70–99)
Potassium: 3.6 mmol/L (ref 3.5–5.2)
Sodium: 137 mmol/L (ref 134–144)
Total Protein: 6.3 g/dL (ref 6.0–8.5)
eGFR: 137 mL/min/{1.73_m2} (ref >59)

## 2022-10-07 LAB — TSH RFX ON ABNORMAL TO FREE T4: TSH: 2.19 u[IU]/mL (ref 0.450–4.500)

## 2022-10-07 LAB — HCV INTERPRETATION

## 2022-10-07 LAB — HEMOGLOBIN A1C
Est. average glucose Bld gHb Est-mCnc: 114 mg/dL
Hgb A1c MFr Bld: 5.6 % (ref 4.8–5.6)

## 2022-10-08 LAB — AFP, SERUM, OPEN SPINA BIFIDA
AFP MoM: 1.12
AFP Value: 52.2 ng/mL
Gest. Age on Collection Date: 17.6 wk
Maternal Age At EDD: 22.5 a
OSBR Risk 1 IN: 10000
Test Results:: NEGATIVE
Weight: 141 [lb_av]

## 2022-10-08 LAB — URINE CULTURE, OB REFLEX

## 2022-10-08 LAB — CULTURE, OB URINE

## 2022-10-14 LAB — PANORAMA PRENATAL TEST FULL PANEL:PANORAMA TEST PLUS 5 ADDITIONAL MICRODELETIONS: FETAL FRACTION: 14.5

## 2022-10-14 LAB — HORIZON CUSTOM: REPORT SUMMARY: NEGATIVE

## 2022-10-29 ENCOUNTER — Other Ambulatory Visit: Payer: Self-pay | Admitting: *Deleted

## 2022-10-29 ENCOUNTER — Ambulatory Visit: Payer: Medicaid Other

## 2022-10-29 ENCOUNTER — Other Ambulatory Visit: Payer: Self-pay | Admitting: Obstetrics and Gynecology

## 2022-10-29 ENCOUNTER — Ambulatory Visit: Payer: Medicaid Other | Attending: Obstetrics and Gynecology

## 2022-10-29 DIAGNOSIS — E039 Hypothyroidism, unspecified: Secondary | ICD-10-CM | POA: Insufficient documentation

## 2022-10-29 DIAGNOSIS — O99891 Other specified diseases and conditions complicating pregnancy: Secondary | ICD-10-CM | POA: Insufficient documentation

## 2022-10-29 DIAGNOSIS — R7303 Prediabetes: Secondary | ICD-10-CM

## 2022-10-29 DIAGNOSIS — Z3400 Encounter for supervision of normal first pregnancy, unspecified trimester: Secondary | ICD-10-CM

## 2022-10-29 DIAGNOSIS — Z363 Encounter for antenatal screening for malformations: Secondary | ICD-10-CM | POA: Diagnosis not present

## 2022-10-29 DIAGNOSIS — O99282 Endocrine, nutritional and metabolic diseases complicating pregnancy, second trimester: Secondary | ICD-10-CM | POA: Diagnosis not present

## 2022-10-29 DIAGNOSIS — Z3402 Encounter for supervision of normal first pregnancy, second trimester: Secondary | ICD-10-CM

## 2022-10-29 DIAGNOSIS — J45909 Unspecified asthma, uncomplicated: Secondary | ICD-10-CM | POA: Diagnosis not present

## 2022-10-29 DIAGNOSIS — Z3A21 21 weeks gestation of pregnancy: Secondary | ICD-10-CM | POA: Insufficient documentation

## 2022-10-29 DIAGNOSIS — O99512 Diseases of the respiratory system complicating pregnancy, second trimester: Secondary | ICD-10-CM | POA: Insufficient documentation

## 2022-10-29 DIAGNOSIS — O24319 Unspecified pre-existing diabetes mellitus in pregnancy, unspecified trimester: Secondary | ICD-10-CM

## 2022-10-29 DIAGNOSIS — Z3A17 17 weeks gestation of pregnancy: Secondary | ICD-10-CM

## 2022-10-29 DIAGNOSIS — E063 Autoimmune thyroiditis: Secondary | ICD-10-CM

## 2022-10-29 DIAGNOSIS — O99280 Endocrine, nutritional and metabolic diseases complicating pregnancy, unspecified trimester: Secondary | ICD-10-CM

## 2022-10-30 ENCOUNTER — Telehealth: Payer: Self-pay

## 2022-10-30 LAB — CYTOLOGY - PAP: Diagnosis: NEGATIVE

## 2022-11-03 ENCOUNTER — Institutional Professional Consult (permissible substitution): Payer: Self-pay | Admitting: Licensed Clinical Social Worker

## 2022-11-03 ENCOUNTER — Ambulatory Visit (INDEPENDENT_AMBULATORY_CARE_PROVIDER_SITE_OTHER): Payer: Medicaid Other | Admitting: Obstetrics & Gynecology

## 2022-11-03 VITALS — BP 89/56 | HR 70 | Wt 144.0 lb

## 2022-11-03 DIAGNOSIS — E063 Autoimmune thyroiditis: Secondary | ICD-10-CM

## 2022-11-03 DIAGNOSIS — R7303 Prediabetes: Secondary | ICD-10-CM | POA: Diagnosis not present

## 2022-11-03 DIAGNOSIS — Z3400 Encounter for supervision of normal first pregnancy, unspecified trimester: Secondary | ICD-10-CM | POA: Diagnosis not present

## 2022-11-03 NOTE — Progress Notes (Unsigned)
   PRENATAL VISIT NOTE  Subjective:  Kelsey Jenkins is a 22 y.o. G1P0 at [redacted]w[redacted]d being seen today for ongoing prenatal care.  She is currently monitored for the following issues for this high-risk pregnancy and has Pre-diabetes; Hypothyroidism, acquired, autoimmune; and Supervision of normal first pregnancy on their problem list.  Patient reports no complaints.  Contractions: Not present. Vag. Bleeding: None.  Movement: Present. Denies leaking of fluid.   The following portions of the patient's history were reviewed and updated as appropriate: allergies, current medications, past family history, past medical history, past social history, past surgical history and problem list.   Objective:   Vitals:   11/03/22 1051  BP: (!) 89/56  Pulse: 70  Weight: 144 lb (65.3 kg)    Fetal Status: Fetal Heart Rate (bpm): 145   Movement: Present     General:  Alert, oriented and cooperative. Patient is in no acute distress.  Skin: Skin is warm and dry. No rash noted.   Cardiovascular: Normal heart rate noted  Respiratory: Normal respiratory effort, no problems with respiration noted  Abdomen: Soft, gravid, appropriate for gestational age.  Pain/Pressure: Absent     Pelvic: Cervical exam deferred        Extremities: Normal range of motion.  Edema: Trace  Mental Status: Normal mood and affect. Normal behavior. Normal judgment and thought content.   Assessment and Plan:  Pregnancy: G1P0 at [redacted]w[redacted]d 1. Supervision of normal first pregnancy, antepartum Normal [place]  2. Hypothyroidism, acquired, autoimmune No longer hypothyroid on no medication  3. Pre-diabetes 2 hr GTT next   Preterm labor symptoms and general obstetric precautions including but not limited to vaginal bleeding, contractions, leaking of fluid and fetal movement were reviewed in detail with the patient. Please refer to After Visit Summary for other counseling recommendations.   Return in about 5 weeks (around 12/08/2022).  Future  Appointments  Date Time Provider Department Center  12/03/2022  7:30 AM [facility] US2 [facility]    Scheryl Darter, MD

## 2022-11-30 ENCOUNTER — Encounter (HOSPITAL_COMMUNITY): Payer: Self-pay

## 2022-11-30 ENCOUNTER — Other Ambulatory Visit: Payer: Self-pay

## 2022-11-30 ENCOUNTER — Emergency Department (HOSPITAL_COMMUNITY)
Admission: EM | Admit: 2022-11-30 | Discharge: 2022-11-30 | Discharge status: Against Medical Advice | Payer: Medicaid Other | Attending: Emergency Medicine | Admitting: Emergency Medicine

## 2022-11-30 DIAGNOSIS — R519 Headache, unspecified: Secondary | ICD-10-CM | POA: Diagnosis not present

## 2022-11-30 DIAGNOSIS — Z3A25 25 weeks gestation of pregnancy: Secondary | ICD-10-CM | POA: Diagnosis not present

## 2022-11-30 DIAGNOSIS — O99512 Diseases of the respiratory system complicating pregnancy, second trimester: Secondary | ICD-10-CM | POA: Diagnosis not present

## 2022-11-30 DIAGNOSIS — B349 Viral infection, unspecified: Secondary | ICD-10-CM | POA: Diagnosis not present

## 2022-11-30 DIAGNOSIS — Z5321 Procedure and treatment not carried out due to patient leaving prior to being seen by health care provider: Secondary | ICD-10-CM | POA: Diagnosis not present

## 2022-11-30 DIAGNOSIS — J029 Acute pharyngitis, unspecified: Secondary | ICD-10-CM | POA: Diagnosis not present

## 2022-11-30 DIAGNOSIS — O98512 Other viral diseases complicating pregnancy, second trimester: Secondary | ICD-10-CM | POA: Insufficient documentation

## 2022-11-30 DIAGNOSIS — Z20822 Contact with and (suspected) exposure to covid-19: Secondary | ICD-10-CM | POA: Insufficient documentation

## 2022-11-30 LAB — CBC WITH DIFFERENTIAL/PLATELET
Abs Immature Granulocytes: 0.04 10*3/uL (ref 0.00–0.07)
Basophils Absolute: 0 10*3/uL (ref 0.0–0.1)
Basophils Relative: 0 %
Eosinophils Absolute: 0.2 10*3/uL (ref 0.0–0.5)
Eosinophils Relative: 2 %
HCT: 31.5 % — ABNORMAL LOW (ref 36.0–46.0)
Hemoglobin: 10.8 g/dL — ABNORMAL LOW (ref 12.0–15.0)
Immature Granulocytes: 0 %
Lymphocytes Relative: 20 %
Lymphs Abs: 2.3 10*3/uL (ref 0.7–4.0)
MCH: 32.9 pg (ref 26.0–34.0)
MCHC: 34.3 g/dL (ref 30.0–36.0)
MCV: 96 fL (ref 80.0–100.0)
Monocytes Absolute: 0.9 10*3/uL (ref 0.1–1.0)
Monocytes Relative: 8 %
Neutro Abs: 8.1 10*3/uL — ABNORMAL HIGH (ref 1.7–7.7)
Neutrophils Relative %: 70 %
Platelets: 271 10*3/uL (ref 150–400)
RBC: 3.28 MIL/uL — ABNORMAL LOW (ref 3.87–5.11)
RDW: 13.2 % (ref 11.5–15.5)
WBC: 11.6 10*3/uL — ABNORMAL HIGH (ref 4.0–10.5)
nRBC: 0 % (ref 0.0–0.2)

## 2022-11-30 LAB — RESP PANEL BY RT-PCR (RSV, FLU A&B, COVID)  RVPGX2
Influenza A by PCR: NEGATIVE
Influenza B by PCR: NEGATIVE
Resp Syncytial Virus by PCR: NEGATIVE
SARS Coronavirus 2 by RT PCR: NEGATIVE

## 2022-11-30 LAB — COMPREHENSIVE METABOLIC PANEL
ALT: 25 U/L (ref 0–44)
AST: 25 U/L (ref 15–41)
Albumin: 3.1 g/dL — ABNORMAL LOW (ref 3.5–5.0)
Alkaline Phosphatase: 91 U/L (ref 38–126)
Anion gap: 10 (ref 5–15)
BUN: 7 mg/dL (ref 6–20)
CO2: 20 mmol/L — ABNORMAL LOW (ref 22–32)
Calcium: 8.6 mg/dL — ABNORMAL LOW (ref 8.9–10.3)
Chloride: 104 mmol/L (ref 98–111)
Creatinine, Ser: 0.43 mg/dL — ABNORMAL LOW (ref 0.44–1.00)
GFR, Estimated: >60 mL/min (ref >60)
Glucose, Bld: 77 mg/dL (ref 70–99)
Potassium: 3.5 mmol/L (ref 3.5–5.1)
Sodium: 134 mmol/L — ABNORMAL LOW (ref 135–145)
Total Bilirubin: 0.6 mg/dL (ref 0.3–1.2)
Total Protein: 6.9 g/dL (ref 6.5–8.1)

## 2022-11-30 LAB — HCG, QUANTITATIVE, PREGNANCY: hCG, Beta Chain, Quant, S: 9311 m[IU]/mL — ABNORMAL HIGH (ref <5)

## 2022-11-30 NOTE — ED Triage Notes (Signed)
Patient arrived reporting sore throat, cough, headache. Also N/V. Pt endorses 25 weeks preg. Exposed to sick kids in the house. Denies any other symptoms

## 2022-11-30 NOTE — ED Provider Triage Note (Cosign Needed)
Emergency Medicine Provider Triage Evaluation Note  Kelsey Jenkins , a 22 y.o. female  was evaluated in triage.  Pt complains of viral syndrome.  Review of Systems  Positive:  Negative:   Physical Exam  LMP 06/03/2022  Gen:   Awake, no distress   Resp:  Normal effort  MSK:   Moves extremities without difficulty  Other:    Medical Decision Making  Medically screening exam initiated at 5:40 PM.  Appropriate orders placed.  Kelsey Jenkins was informed that the remainder of the evaluation will be completed by another provider, this initial triage assessment does not replace that evaluation, and the importance of remaining in the ED until their evaluation is complete.  Hx of asthma. Headache, sore throat, cough, nausea, vomiting, diarrhea, sweats x4 days. Patient is [redacted] weeks pregnant confirmed by [place]. Denies fever, hematemesis, hematochezia. Does have 2 sick kids at home. Denies vaginal bleeding, pelvic pain.    Dorthy Cooler, [place] 11/30/22 1742

## 2022-12-03 ENCOUNTER — Ambulatory Visit: Payer: Medicaid Other

## 2022-12-04 ENCOUNTER — Other Ambulatory Visit: Payer: Self-pay | Admitting: *Deleted

## 2022-12-04 ENCOUNTER — Other Ambulatory Visit: Payer: Self-pay | Admitting: Maternal & Fetal Medicine

## 2022-12-04 ENCOUNTER — Ambulatory Visit: Payer: Medicaid Other | Attending: Maternal & Fetal Medicine

## 2022-12-04 DIAGNOSIS — O99512 Diseases of the respiratory system complicating pregnancy, second trimester: Secondary | ICD-10-CM

## 2022-12-04 DIAGNOSIS — O99282 Endocrine, nutritional and metabolic diseases complicating pregnancy, second trimester: Secondary | ICD-10-CM

## 2022-12-04 DIAGNOSIS — Z3A26 26 weeks gestation of pregnancy: Secondary | ICD-10-CM

## 2022-12-04 DIAGNOSIS — E039 Hypothyroidism, unspecified: Secondary | ICD-10-CM | POA: Diagnosis not present

## 2022-12-04 DIAGNOSIS — O24319 Unspecified pre-existing diabetes mellitus in pregnancy, unspecified trimester: Secondary | ICD-10-CM | POA: Insufficient documentation

## 2022-12-04 DIAGNOSIS — O99280 Endocrine, nutritional and metabolic diseases complicating pregnancy, unspecified trimester: Secondary | ICD-10-CM | POA: Diagnosis not present

## 2022-12-04 DIAGNOSIS — J45909 Unspecified asthma, uncomplicated: Secondary | ICD-10-CM | POA: Diagnosis not present

## 2022-12-04 DIAGNOSIS — O99810 Abnormal glucose complicating pregnancy: Secondary | ICD-10-CM | POA: Diagnosis not present

## 2022-12-04 DIAGNOSIS — O365920 Maternal care for other known or suspected poor fetal growth, second trimester, not applicable or unspecified: Secondary | ICD-10-CM

## 2022-12-08 ENCOUNTER — Ambulatory Visit (INDEPENDENT_AMBULATORY_CARE_PROVIDER_SITE_OTHER): Payer: Medicaid Other | Admitting: Advanced Practice Midwife

## 2022-12-08 VITALS — BP 95/56 | HR 66 | Wt 143.8 lb

## 2022-12-08 DIAGNOSIS — O365990 Maternal care for other known or suspected poor fetal growth, unspecified trimester, not applicable or unspecified: Secondary | ICD-10-CM | POA: Diagnosis not present

## 2022-12-08 DIAGNOSIS — O0990 Supervision of high risk pregnancy, unspecified, unspecified trimester: Secondary | ICD-10-CM | POA: Diagnosis not present

## 2022-12-08 DIAGNOSIS — E063 Autoimmune thyroiditis: Secondary | ICD-10-CM

## 2022-12-08 DIAGNOSIS — Z3A26 26 weeks gestation of pregnancy: Secondary | ICD-10-CM | POA: Diagnosis not present

## 2022-12-08 DIAGNOSIS — R7303 Prediabetes: Secondary | ICD-10-CM | POA: Diagnosis not present

## 2022-12-08 NOTE — Progress Notes (Signed)
   PRENATAL VISIT NOTE  Subjective:  Kelsey Jenkins is a 22 y.o. G1P0 at [redacted]w[redacted]d being seen today for ongoing prenatal care.  She is currently monitored for the following issues for this high-risk pregnancy and has Pre-diabetes; Hypothyroidism, acquired, autoimmune; Supervision of normal first pregnancy; and Fetal growth restriction antepartum on their problem list.  Patient reports no complaints.  Contractions: Not present. Vag. Bleeding: None.  Movement: Present. Denies leaking of fluid.   The following portions of the patient's history were reviewed and updated as appropriate: allergies, current medications, past family history, past medical history, past social history, past surgical history and problem list.   Objective:   Vitals:   12/08/22 1010  BP: (!) 95/56  Pulse: 66  Weight: 143 lb 12.8 oz (65.2 kg)    Fetal Status: Fetal Heart Rate (bpm): 144 Fundal Height: 23 cm Movement: Present     General:  Alert, oriented and cooperative. Patient is in no acute distress.  Skin: Skin is warm and dry. No rash noted.   Cardiovascular: Normal heart rate noted  Respiratory: Normal respiratory effort, no problems with respiration noted  Abdomen: Soft, gravid, appropriate for gestational age.  Pain/Pressure: Present     Pelvic: Cervical exam deferred        Extremities: Normal range of motion.  Edema: None  Mental Status: Normal mood and affect. Normal behavior. Normal judgment and thought content.   Assessment and Plan:  Pregnancy: G1P0 at [redacted]w[redacted]d 1. Supervision of normal first pregnancy, antepartum --Anticipatory guidance about next visits/weeks of pregnancy given.   2. Hypothyroidism, acquired, autoimmune --TSH wnl on 10/06/22 without meds.  Meds were d/c'd by PCP prior to pregnancy due to normal values  3. Pre-diabetes --Hx prediabetes before pregnancy, was on Metformin. No meds now.  A1C at NOB 5.6.  GTT today.  4. [redacted] weeks gestation of pregnancy  5. Fetal growth restriction  antepartum --EFW <1% on 10/25, dopplers wnl, follow up scheduled in 2 weeks --Pt did not know she was pregnant until second trimester, is now trying to eat more calories --Recommend more protein, discussed options including protein shakes, meat, eggs, plant based proteins.   --F/U with  MFM as scheduled  Preterm labor symptoms and general obstetric precautions including but not limited to vaginal bleeding, contractions, leaking of fluid and fetal movement were reviewed in detail with the patient. Please refer to After Visit Summary for other counseling recommendations.   Return in about 4 weeks (around 01/05/2023) for with me for GTT.  Future Appointments  Date Time Provider Department Center  12/22/2022  2:30 PM [facility] US[facility]  12/31/2022  8:15 AM [facility]  01/04/2023  8:30 AM [facility] None  01/04/2023  9:35 AM Left[place]-Kirby, Wilmer Floor, CNM [facility] None    Sharen Counter, CNM

## 2022-12-08 NOTE — Progress Notes (Signed)
Pt presents for ROB visit. Pt c/o vaginal pain and pressure

## 2022-12-10 ENCOUNTER — Telehealth: Payer: Self-pay

## 2022-12-10 NOTE — Telephone Encounter (Signed)
Left message with appointment information for tomorrow.  NST at 1:15pm and ultrasound at 2:30pm   Please arrive at 1pm

## 2022-12-11 ENCOUNTER — Other Ambulatory Visit: Payer: Self-pay

## 2022-12-11 ENCOUNTER — Encounter (HOSPITAL_COMMUNITY): Payer: Self-pay | Admitting: Family Medicine

## 2022-12-11 ENCOUNTER — Ambulatory Visit: Payer: Medicaid Other

## 2022-12-11 ENCOUNTER — Inpatient Hospital Stay (HOSPITAL_BASED_OUTPATIENT_CLINIC_OR_DEPARTMENT_OTHER): Payer: Medicaid Other

## 2022-12-11 ENCOUNTER — Inpatient Hospital Stay (HOSPITAL_COMMUNITY)
Admission: AD | Admit: 2022-12-11 | Discharge: 2022-12-11 | Disposition: A | Discharge status: Home / Self Care | Payer: Medicaid Other | Attending: Family Medicine | Admitting: Family Medicine

## 2022-12-11 ENCOUNTER — Ambulatory Visit: Payer: Medicaid Other | Attending: Maternal & Fetal Medicine | Admitting: *Deleted

## 2022-12-11 DIAGNOSIS — O99322 Drug use complicating pregnancy, second trimester: Secondary | ICD-10-CM | POA: Insufficient documentation

## 2022-12-11 DIAGNOSIS — O99512 Diseases of the respiratory system complicating pregnancy, second trimester: Secondary | ICD-10-CM

## 2022-12-11 DIAGNOSIS — E039 Hypothyroidism, unspecified: Secondary | ICD-10-CM

## 2022-12-11 DIAGNOSIS — O99282 Endocrine, nutritional and metabolic diseases complicating pregnancy, second trimester: Secondary | ICD-10-CM

## 2022-12-11 DIAGNOSIS — O365920 Maternal care for other known or suspected poor fetal growth, second trimester, not applicable or unspecified: Secondary | ICD-10-CM | POA: Insufficient documentation

## 2022-12-11 DIAGNOSIS — O24112 Pre-existing diabetes mellitus, type 2, in pregnancy, second trimester: Secondary | ICD-10-CM | POA: Insufficient documentation

## 2022-12-11 DIAGNOSIS — Z3A27 27 weeks gestation of pregnancy: Secondary | ICD-10-CM | POA: Insufficient documentation

## 2022-12-11 DIAGNOSIS — O288 Other abnormal findings on antenatal screening of mother: Secondary | ICD-10-CM

## 2022-12-11 DIAGNOSIS — J45909 Unspecified asthma, uncomplicated: Secondary | ICD-10-CM

## 2022-12-11 DIAGNOSIS — O365990 Maternal care for other known or suspected poor fetal growth, unspecified trimester, not applicable or unspecified: Secondary | ICD-10-CM

## 2022-12-11 DIAGNOSIS — O26892 Other specified pregnancy related conditions, second trimester: Secondary | ICD-10-CM | POA: Diagnosis not present

## 2022-12-11 HISTORY — DX: Unspecified ovarian cyst, unspecified side: N83.209

## 2022-12-11 NOTE — MAU Note (Signed)
Kelsey Jenkins is a 22 y.o. at [redacted]w[redacted]d here in MAU reporting: sent from [facility] today for fetal monitoring. Had decels in the office. Endorses +FM, denies LOF and VB.   Onset of complaint: 1400 Pain score: 0/10 Vitals:   12/11/22 1501  BP: (!) 102/57  Pulse: 62  Resp: 15  Temp: 98.3 F (36.8 C)  SpO2: 100%     FHT:142 Lab orders placed from triage:  none

## 2022-12-11 NOTE — MAU Provider Note (Addendum)
History     CSN: [id number]  Arrival date and time: 12/11/22 1416   Event Date/Time   First Provider Initiated Contact with Patient 12/11/22 1600      Chief Complaint  Patient presents with   fetal monitoring   HPI Ms. Kelsey Jenkins is a 22 y.o. year old G1P0 female at [redacted]w[redacted]d weeks gestation who was sent to MAU reporting NRNST with a decel at MFM. She reports good FM. She denies pain, VB or LOF. She receives [facility] with [facility]; next appt is 01/04/2023. Next MFM appt is 12/22/2022.    OB History     Gravida  1   Para      Term      Preterm      AB      Living         SAB      IAB      Ectopic      Multiple      Live Births              Past Medical History:  Diagnosis Date   Asthma 2003   Diabetes mellitus without complication (HCC)    Hypoactive thyroid    Ovarian cyst     Past Surgical History:  Procedure Laterality Date   CYSTECTOMY     LAPAROSCOPIC APPENDECTOMY N/A 01/10/2016   Procedure: APPENDECTOMY LAPAROSCOPIC;  Surgeon: Leonia Corona, MD;  Location: [facility] OR;  Service: General;  Laterality: N/A;    Family History  Problem Relation Age of Onset   Diabetes Mother    Obesity Mother    Thyroid disease Mother    Ovarian cysts Mother    Diabetes Maternal Grandmother    Hypertension Maternal Grandmother    Thyroid disease Maternal Grandmother    Hypertension Maternal Grandfather     Social History   Tobacco Use   Smoking status: Never    Passive exposure: Yes   Smokeless tobacco: Never  Substance Use Topics   Alcohol use: No   Drug use: Yes    Types: Marijuana    Allergies:  Allergies  Allergen Reactions   Penicillins Hives    Medications Prior to Admission  Medication Sig Dispense Refill Last Dose   albuterol (VENTOLIN HFA) 108 (90 Base) MCG/ACT inhaler Inhale 2 puffs into the lungs every 6 (six) hours as needed for wheezing or shortness of breath.   12/11/2022 at 0400   aspirin EC 81 MG tablet Take 1 tablet (81 mg total)  by mouth daily. Start taking when you are [redacted] weeks pregnant for rest of pregnancy for prevention of preeclampsia 90 tablet 2 Past Week   loratadine (CLARITIN) 10 MG tablet Take 10 mg by mouth every evening.   Past Week   acetaminophen (TYLENOL) 500 MG tablet Take 1,000 mg by mouth every 6 (six) hours as needed for headache (pain).      Blood Pressure Monitoring (BLOOD PRESSURE KIT) DEVI 1 kit by Does not apply route once a week. Check Blood Pressure regularly and record readings into the [employer] App.  Large Cuff.  DX O90.0 1 each 0    doxylamine, Sleep, (UNISOM) 25 MG tablet Take 1 tablet (25 mg total) by mouth at bedtime. 30 tablet 2    famotidine (PEPCID) 20 MG tablet Take 1 tablet (20 mg total) by mouth daily. 30 tablet 3    Misc. Devices (GOJJI WEIGHT SCALE) MISC 1 Device by Does not apply route once a week. 1 each 0  ondansetron (ZOFRAN-ODT) 4 MG disintegrating tablet Take 1 tablet (4 mg total) by mouth every 8 (eight) hours as needed for nausea. 12 tablet 0    pyridOXINE (VITAMIN B6) 25 MG tablet Take 1 tablet (25 mg total) by mouth in the morning, at noon, and at bedtime. 90 tablet 2     Review of Systems  Constitutional: Negative.   HENT: Negative.    Eyes: Negative.   Respiratory: Negative.    Cardiovascular: Negative.   Gastrointestinal: Negative.   Endocrine: Negative.   Genitourinary: Negative.   Musculoskeletal: Negative.   Skin: Negative.   Allergic/Immunologic: Negative.   Neurological: Negative.   Hematological: Negative.   Psychiatric/Behavioral: Negative.     Physical Exam   Blood pressure (!) 102/57, pulse 62, temperature 98.3 F (36.8 C), temperature source Oral, resp. rate 15, height 5\' 3"  (1.6 m), weight 66.2 kg, last menstrual period 06/03/2022, SpO2 100%.  Physical Exam Vitals and nursing note reviewed.  Constitutional:      Appearance: Normal appearance. She is normal weight.  Cardiovascular:     Rate and Rhythm: Normal rate.  Pulmonary:      Effort: Pulmonary effort is normal.  Abdominal:     Palpations: Abdomen is soft.  Genitourinary:    Comments: Not indicated Musculoskeletal:        General: Normal range of motion.  Skin:    General: Skin is warm and dry.  Neurological:     Mental Status: She is alert and oriented to person, place, and time.  Psychiatric:        Mood and Affect: Mood normal.        Behavior: Behavior normal.        Thought Content: Thought content normal.        Judgment: Judgment normal.    REASSURING NST - FHR: 140 bpm / moderate variability / accels present /  1 small decel present / TOCO: none  [place] MFM UA Cord Doppler  Result Date: 12/11/2022 ----------------------------------------------------------------------  OBSTETRICS REPORT                       (Signed Final 12/11/2022 06:43 pm) ---------------------------------------------------------------------- Patient Info  ID #:       [id number]                          D.O.B.:  [birth year] (22 yrs)  Name:       Kelsey Jenkins                 Visit Date: 12/11/2022 05:30 pm ---------------------------------------------------------------------- Performed By  Attending:        Ma Rings MD         Ref. Address:      [address]  Performed By:     Isabel Caprice        [facility]  Referred By:      Lennart Pall MD ---------------------------------------------------------------------- Orders  #  Description                           Code        Ordered By  1  [place] MFM UA CORD DOPPLER                76820.02    Tinnie Gens  2  [place] MFM OB LIMITED                     [id number]    Gladys Gutman ----------------------------------------------------------------------  #  Order #                     Accession #                Episode #  1  [id number]                   [id number]                 [id number]  2  [id number]                   [id number]                  [id number] ---------------------------------------------------------------------- Indications  Maternal care for known or suspected poor       O36.5920  fetal growth, second trimester, single or  unspecified fetus IUGR  Non-reactive NST                                O28.9  Hypothyroid                                     O99.280 E03.9  [redacted] weeks gestation of pregnancy                 Z3A.27  Asthma                                          O99.89 j45.909 ---------------------------------------------------------------------- Fetal Evaluation  Num Of Fetuses:          1  Fetal Heart Rate(bpm):   134  Cardiac Activity:        Observed  Presentation:            Breech  Placenta:                Anterior  Amniotic Fluid  AFI FV:      Within normal limits  AFI Sum(cm)     %Tile       Largest Pocket(cm)  12.5            32          4.2  RUQ(cm)       RLQ(cm)       LUQ(cm)        LLQ(cm)  4.2           2.8           2.6            2.9 ---------------------------------------------------------------------- OB History  Blood Type:   O+  Maternal Racial/Ethnic Group:   Black (non-Hispanic)  Gravidity:    1  Term:   0        Prem:   0        SAB:   0  TOP:          0       Ectopic:  0        Living: 0 ---------------------------------------------------------------------- Gestational Age  LMP:           27w 2d        Date:  06/03/22                  EDD:   03/10/23  Best:          27w 2d     Det. By:  LMP  (06/03/22)          EDD:   03/10/23 ---------------------------------------------------------------------- Doppler - Fetal Vessels  Umbilical Artery    S/D    %tile      RI    %tile      PI    %tile     PSV    ADFV    RDFV                                                     (cm/s)    5.2   > 97.5     0.8       96     1.5   > 97.5     32.1      No      No ---------------------------------------------------------------------- Comments  This patient was sent to the MAU due to an IUGR fetus. A  variable deceleration was  noted during her NST in the MFM  office.  She had a reactive tracing for her gestational age in the MAU.  An ultrasound performed in the MAU showed normal amniotic  fluid with a total AFI of 12.5 cm.  Doppler studies of the umbilical arteries performed due to  fetal growth restriction showed an elevatedS/D ratio of 5.2.  There were no signs of absent or reversed end-diastolic flow  noted today. This is a stable finding as compared to her exam  last week.  Due to IUGR, I will make arrangements for the patient to  return to the MFM office in 3 days (on Monday) for another  umbilical artery Doppler study and NST. ----------------------------------------------------------------------                   Ma Rings, MD Electronically Signed Final Report   12/11/2022 06:43 pm ----------------------------------------------------------------------   [place] MFM OB LIMITED  Result Date: 12/11/2022 ----------------------------------------------------------------------  OBSTETRICS REPORT                       (Signed Final 12/11/2022 06:43 pm) ---------------------------------------------------------------------- Patient Info  ID #:       [id number]                          D.O.B.:  [birth year] (22 yrs)  Name:       [facility]                 Visit Date: 12/11/2022 05:30 pm ---------------------------------------------------------------------- Performed By  Attending:        Ma Rings MD         Ref.  Address:      [address]  Performed By:     Isabel Caprice        [facility]  Referred By:      Lennart Pall MD ---------------------------------------------------------------------- Orders  #  Description                           Code        Ordered By  1  [place] MFM UA CORD DOPPLER                76820.02    [place]  2  [place] MFM OB LIMITED                     76815.01    Kasin Tonkinson  ----------------------------------------------------------------------  #  Order #                     Accession #                Episode #  1  [id number]                   [id number]                 [id number]  2  [id number]                   [id number]                 [id number] ---------------------------------------------------------------------- Indications  Maternal care for known or suspected poor       O36.5920  fetal growth, second trimester, single or  unspecified fetus IUGR  Non-reactive NST                                O28.9  Hypothyroid                                     O99.280 E03.9  [redacted] weeks gestation of pregnancy                 Z3A.27  Asthma                                          O99.89 j45.909 ---------------------------------------------------------------------- Fetal Evaluation  Num Of Fetuses:          1  Fetal Heart Rate(bpm):   134  Cardiac Activity:        Observed  Presentation:            Breech  Placenta:                Anterior  Amniotic Fluid  AFI  FV:      Within normal limits  AFI Sum(cm)     %Tile       Largest Pocket(cm)  12.5            32          4.2  RUQ(cm)       RLQ(cm)       LUQ(cm)        LLQ(cm)  4.2           2.8           2.6            2.9 ---------------------------------------------------------------------- OB History  Blood Type:   O+  Maternal Racial/Ethnic Group:   Black (non-Hispanic)  Gravidity:    1         Term:   0        Prem:   0        SAB:   0  TOP:          0       Ectopic:  0        Living: 0 ---------------------------------------------------------------------- Gestational Age  LMP:           27w 2d        Date:  06/03/22                  EDD:   03/10/23  Best:          27w 2d     Det. By:  LMP  (06/03/22)          EDD:   03/10/23 ---------------------------------------------------------------------- Doppler - Fetal Vessels  Umbilical Artery    S/D    %tile      RI    %tile      PI    %tile     PSV    ADFV    RDFV                                                      (cm/s)    5.2   > 97.5     0.8       96     1.5   > 97.5     32.1      No      No ---------------------------------------------------------------------- Comments  This patient was sent to the MAU due to an IUGR fetus. A  variable deceleration was noted during her NST in the MFM  office.  She had a reactive tracing for her gestational age in the MAU.  An ultrasound performed in the MAU showed normal amniotic  fluid with a total AFI of 12.5 cm.  Doppler studies of the umbilical arteries performed due to  fetal growth restriction showed an elevatedS/D ratio of 5.2.  There were no signs of absent or reversed end-diastolic flow  noted today. This is a stable finding as compared to her exam  last week.  Due to IUGR, I will make arrangements for the patient to  return to the MFM office in 3 days (on Monday) for another  umbilical artery Doppler study and NST. ----------------------------------------------------------------------                   Ma Rings, MD Electronically Signed Final Report   12/11/2022 06:43 pm ----------------------------------------------------------------------  [place] MFM OB FOLLOW UP  Result Date: 12/04/2022 ----------------------------------------------------------------------  OBSTETRICS REPORT                       (Signed Final 12/04/2022 12:01 pm) ---------------------------------------------------------------------- Patient Info  ID #:       [id number]                          D.O.B.:  2000-04-30 (22 yrs)  Name:       [facility]                 Visit Date: 12/04/2022 10:36 am ---------------------------------------------------------------------- Performed By  Attending:        Braxton Feathers DO       Ref. Address:     [address]  Performed By:     Reinaldo Raddle            [facility]  Referred By:      Lennart Pall MD ---------------------------------------------------------------------- Orders  #  Description                           Code        Ordered By  1  [place] MFM OB FOLLOW UP                   84132.44    Braxton Feathers  2  [place] MFM UA CORD DOPPLER                76820.02    [facility] ----------------------------------------------------------------------  #  Order #                     Accession #                Episode #  1  [id number]                   [id number]                 [id number]  2  [id number]  [id number]                 [id number] ---------------------------------------------------------------------- Indications  Hypothyroid                                    O99.280 E03.9  Prediabetes in mother during pregnancy         O99.810  Asthma                                         O99.89 j45.909  [redacted] weeks gestation of pregnancy                Z3A.26  LR NIPS / Neg Horizon / Neg AFP ---------------------------------------------------------------------- Fetal Evaluation  Num Of Fetuses:         1  Fetal Heart Rate(bpm):  137  Cardiac Activity:       Observed  Presentation:           Cephalic  Placenta:               Anterior  P. Cord Insertion:      Visualized, central  Amniotic Fluid  AFI FV:      Within normal limits                              Largest Pocket(cm)                              4 ---------------------------------------------------------------------- Biometry  BPD:      58.3  mm     G. Age:  23w 6d        < 1  %    CI:        70.43   %    70 - 86                                                          FL/HC:      19.6   %    18.6 - 20.4  HC:      221.5  mm     G. Age:  24w 1d        < 1  %    HC/AC:      1.22        1.04 - 1.22  AC:      181.8  mm     G. Age:  23w 0d        < 1  %    FL/BPD:     74.4   %    71 - 87  FL:       43.4  mm     G. Age:  24w 2d        1.8  %    FL/AC:       23.9   %    20 - 24  HUM:      38.7  mm     G. Age:  23w 5d        < 5  %  LV:        0.9  mm  Est. FW:     613  gm      1 lb 6 oz    < 1  % ---------------------------------------------------------------------- OB History  Blood Type:   O+  Maternal Racial/Ethnic Group:   Black (non-Hispanic)  Gravidity:    1         Term:   0        Prem:   0        SAB:   0  TOP:          0       Ectopic:  0        Living: 0 ---------------------------------------------------------------------- Gestational Age  LMP:           26w 2d        Date:  06/03/22                  EDD:   03/10/23  U/S Today:     23w 6d                                        EDD:   03/27/23  Best:          [place]al 2d     Det. By:  LMP  (06/03/22)          EDD:   03/10/23 ---------------------------------------------------------------------- Targeted Anatomy  Central Nervous System  Calvarium/Cranial V.:  Appears normal         Choroid Plexus:         Previously seen  Intracranial Anat:     Appears normal         Cereb./Vermis:          Previously seen  Cavum:                 Appears normal         Cisterna Magna:         Previously seen  Lateral Ventricles:    Appears normal         Midline Falx:           Previously seen  Spine  Cervical:              Previously seen        Sacral:                 Previously seen  Thoracic:              Previously seen        Shape/Curvature:        Previously seen  Lumbar:                Previously seen  Head/Neck  Face:                  Previously seen        Nasal Bone:             Appears normal  Lips:                  Previously seen        Profile:                Appears normal  Nuchal Fold:           Not applicable  Orbits/Eyes:            Previously seen  Thorax  4 Chamber View:        Appears normal         SVC:                    Appears normal  Cardiac Activity:      Observed               Interventr. Septum:     Appears normal  Cardiac Rhythm:        Appears normal         Cardiac Axis:           Appears normal   Cardiac Situs:         Appears normal         Diaphragm:              Appears normal  Rt Outflow Tract:      Previously seen        3 Vessel View:          Previously seen  Lt Outflow Tract:      Appears normal         3 V Trachea View:       Previously seen  Aortic Arch:           Previously seen        IVC:                    Appears normal  Ductal Arch:           Previously seen  Abdomen  Ventral Wall:          Previously seen        Lt Kidney:              Appears normal  Cord Insertion:        Previously seen        Rt Kidney:              Appears normal  Situs:                 Previously seen        Bladder:                Appears normal  Stomach:               Appears normal  Extremities  Lt Humerus:            Previously seen        Lt Femur:               Previously seen  Rt Humerus:            Previously seen        Rt Femur:               Previously seen  Lt Forearm:            Previously seen        Lt Lower Leg:           Previously seen  Rt Forearm:            Previously seen        Rt Lower Leg:           Previously seen  Lt Hand:  Previously seen        Lt Foot:                Previously seen  Rt Hand:               Previously seen        Rt Foot:                Previously seen  Other  Umbilical Cord:        Previously seen        Genitalia:              Previously seen ---------------------------------------------------------------------- Doppler - Fetal Vessels  Umbilical Artery   S/D     %tile      RI    %tile      PI    %tile     PSV    ADFV    RDFV                                                     (cm/s)    5.3   > 97.5    0.81       96    1.56   > 97.5    29.96      No      No ---------------------------------------------------------------------- Cervix Uterus Adnexa  Cervix  Not visualized (advanced GA >24wks)  Uterus  No abnormality visualized.  Right Ovary  Not visualized.  Left Ovary  Size(cm)     1.72   x   0.82   x  1.3       Vol(ml): 0.96  Within normal limits.  Cul De  Sac  No free fluid seen.  Adnexa  No adnexal mass visualized ---------------------------------------------------------------------- Comments  The patient is here for a follow-up ultrasound for FGR at 26w  2d. EDD: 03/10/2023. Dating: LMP  (06/03/22). She has no  concerns today.  Sonographic findings  Single intrauterine pregnancy.  Fetal cardiac activity:  Observed and appears normal.  Presentation: Cephalic.  Interval fetal anatomy appears normal.  Fetal biometry shows the estimated fetal weight at the < 1  percentile and the abdominal circumference at the <1  percentile (very difficult position to obtain proper  measurements but still appear small).  Amniotic fluid volume: Within normal limits. MVP: 4. cm.  Placenta: Anterior.  Umbilical artery dopplers findings:  -S/D: 4.8 which are at the upper limit of normal with some  elevated dopplers at this gestational age.  -Absent end-diastolic flow: No.  -Reversed end-diastolic flow:  No.  Recommendations  - Continue weekly UA dopplers  - NST next week  - Serial growth [place] every 3 weeks until delivery.  I discussed the potential adverse obstetric, fetal and neonatal  associations with FGR. I discussed the potential for fetal  hypoxia as well as monitoring through growth ultrasound and  antenatal testing. Delivery timing may occur prior to EDD if  indicatd based on fetal growth, anteatal testing and clincal  course. The patient had time to ask questions that were  answered to her satisfaction. She verbalized understanding  of our discussion and request to proceed with the plan  outlined in the recommendations.  I discussed the limitations of prenatal ultrasound with the  patient including inability to detect certain abnormalities and  poor visualization of certain  anatomic structures due to fetal  position, gestational age and maternal body habitus.  The  patient verbalized understanding and had time to ask  questions that were answered to her satisfaction.  ----------------------------------------------------------------------                   Braxton Feathers, DO Electronically Signed Final Report   12/04/2022 12:01 pm ----------------------------------------------------------------------   [place] MFM UA CORD DOPPLER  Result Date: 12/04/2022 ----------------------------------------------------------------------  OBSTETRICS REPORT                       (Signed Final 12/04/2022 12:01 pm) ---------------------------------------------------------------------- Patient Info  ID #:       [id number]                          D.O.B.:  July 31, 2000 (22 yrs)  Name:       [facility]                 Visit Date: 12/04/2022 10:36 am ---------------------------------------------------------------------- Performed By  Attending:        Braxton Feathers DO       Ref. Address:     [address]  Performed By:     Reinaldo Raddle            [facility]  Referred By:      Lennart Pall MD ---------------------------------------------------------------------- Orders  #  Description                           Code        Ordered By  1  [place] MFM OB FOLLOW UP                   76816.01    BURK SCHAIBLE  2  [place] MFM UA CORD DOPPLER                52841.32    Braxton Feathers ----------------------------------------------------------------------  #  Order #                     Accession #  Episode #  1  [id number]                   [id number]                 [id number]  2  [id number]                   [id number]                 [id number] ---------------------------------------------------------------------- Indications  Hypothyroid                                    O99.280 E03.9  Prediabetes in mother during pregnancy         O99.810   Asthma                                         O99.89 j45.909  [redacted] weeks gestation of pregnancy                Z3A.26  LR NIPS / Neg Horizon / Neg AFP ---------------------------------------------------------------------- Fetal Evaluation  Num Of Fetuses:         1  Fetal Heart Rate(bpm):  137  Cardiac Activity:       Observed  Presentation:           Cephalic  Placenta:               Anterior  P. Cord Insertion:      Visualized, central  Amniotic Fluid  AFI FV:      Within normal limits                              Largest Pocket(cm)                              4 ---------------------------------------------------------------------- Biometry  BPD:      58.3  mm     G. Age:  23w 6d        < 1  %    CI:        70.43   %    70 - 86                                                          FL/HC:      19.6   %    18.6 - 20.4  HC:      221.5  mm     G. Age:  24w 1d        < 1  %    HC/AC:      1.22        1.04 - 1.22  AC:      181.8  mm     G. Age:  23w 0d        < 1  %    FL/BPD:     74.4   %    71 - 87  FL:       43.4  mm  G. Age:  24w 2d        1.8  %    FL/AC:      23.9   %    20 - 24  HUM:      38.7  mm     G. Age:  23w 5d        < 5  %  LV:        0.9  mm  Est. FW:     613  gm      1 lb 6 oz    < 1  % ---------------------------------------------------------------------- OB History  Blood Type:   O+  Maternal Racial/Ethnic Group:   Black (non-Hispanic)  Gravidity:    1         Term:   0        Prem:   0        SAB:   0  TOP:          0       Ectopic:  0        Living: 0 ---------------------------------------------------------------------- Gestational Age  LMP:           26w 2d        Date:  06/03/22                  EDD:   03/10/23  U/S Today:     23w 6d                                        EDD:   03/27/23  Best:          26w 2d     Det. By:  LMP  (06/03/22)          EDD:   03/10/23 ---------------------------------------------------------------------- Targeted Anatomy  Central Nervous System  Calvarium/Cranial  V.:  Appears normal         Choroid Plexus:         Previously seen  Intracranial Anat:     Appears normal         Cereb./Vermis:          Previously seen  Cavum:                 Appears normal         Cisterna Magna:         Previously seen  Lateral Ventricles:    Appears normal         Midline Falx:           Previously seen  Spine  Cervical:              Previously seen        Sacral:                 Previously seen  Thoracic:              Previously seen        Shape/Curvature:        Previously seen  Lumbar:                Previously seen  Head/Neck  Face:                  Previously seen        Nasal Bone:  Appears normal  Lips:                  Previously seen        Profile:                Appears normal  Nuchal Fold:           Not applicable         Orbits/Eyes:            Previously seen  Thorax  4 Chamber View:        Appears normal         SVC:                    Appears normal  Cardiac Activity:      Observed               Interventr. Septum:     Appears normal  Cardiac Rhythm:        Appears normal         Cardiac Axis:           Appears normal  Cardiac Situs:         Appears normal         Diaphragm:              Appears normal  Rt Outflow Tract:      Previously seen        3 Vessel View:          Previously seen  Lt Outflow Tract:      Appears normal         3 V Trachea View:       Previously seen  Aortic Arch:           Previously seen        IVC:                    Appears normal  Ductal Arch:           Previously seen  Abdomen  Ventral Wall:          Previously seen        Lt Kidney:              Appears normal  Cord Insertion:        Previously seen        Rt Kidney:              Appears normal  Situs:                 Previously seen        Bladder:                Appears normal  Stomach:               Appears normal  Extremities  Lt Humerus:            Previously seen        Lt Femur:               Previously seen  Rt Humerus:            Previously seen        Rt Femur:                Previously seen  Lt Forearm:            Previously seen  Lt Lower Leg:           Previously seen  Rt Forearm:            Previously seen        Rt Lower Leg:           Previously seen  Lt Hand:               Previously seen        Lt Foot:                Previously seen  Rt Hand:               Previously seen        Rt Foot:                Previously seen  Other  Umbilical Cord:        Previously seen        Genitalia:              Previously seen ---------------------------------------------------------------------- Doppler - Fetal Vessels  Umbilical Artery   S/D     %tile      RI    %tile      PI    %tile     PSV    ADFV    RDFV                                                     (cm/s)    5.3   > 97.5    0.81       96    1.56   > 97.5    29.96      No      No ---------------------------------------------------------------------- Cervix Uterus Adnexa  Cervix  Not visualized (advanced GA >24wks)  Uterus  No abnormality visualized.  Right Ovary  Not visualized.  Left Ovary  Size(cm)     1.72   x   0.82   x  1.3       Vol(ml): 0.96  Within normal limits.  Cul De Sac  No free fluid seen.  Adnexa  No adnexal mass visualized ---------------------------------------------------------------------- Comments  The patient is here for a follow-up ultrasound for FGR at 26w  2d. EDD: 03/10/2023. Dating: LMP  (06/03/22). She has no  concerns today.  Sonographic findings  Single intrauterine pregnancy.  Fetal cardiac activity:  Observed and appears normal.  Presentation: Cephalic.  Interval fetal anatomy appears normal.  Fetal biometry shows the estimated fetal weight at the < 1  percentile and the abdominal circumference at the <1  percentile (very difficult position to obtain proper  measurements but still appear small).  Amniotic fluid volume: Within normal limits. MVP: 4. cm.  Placenta: Anterior.  Umbilical artery dopplers findings:  -S/D: 4.8 which are at the upper limit of normal with some  elevated dopplers at this  gestational age.  -Absent end-diastolic flow: No.  -Reversed end-diastolic flow:  No.  Recommendations  - Continue weekly UA dopplers  - NST next week  - Serial growth [place] every 3 weeks until delivery.  I discussed the potential adverse obstetric, fetal and neonatal  associations with FGR. I discussed the potential for fetal  hypoxia as well as monitoring through growth ultrasound and  antenatal testing. Delivery timing may occur prior to EDD  if  indicatd based on fetal growth, anteatal testing and clincal  course. The patient had time to ask questions that were  answered to her satisfaction. She verbalized understanding  of our discussion and request to proceed with the plan  outlined in the recommendations.  I discussed the limitations of prenatal ultrasound with the  patient including inability to detect certain abnormalities and  poor visualization of certain anatomic structures due to fetal  position, gestational age and maternal body habitus.  The  patient verbalized understanding and had time to ask  questions that were answered to her satisfaction. ----------------------------------------------------------------------                   Braxton Feathers, DO Electronically Signed Final Report   12/04/2022 12:01 pm ----------------------------------------------------------------------    MAU Course  Procedures  MDM Prolonged NST UA Dopplers - stable abnl, no AEDF and no REDF   Raelyn Mora, CNM 12/11/2022, 4:00 PM  Assessment and Plan   1. Fetal growth restriction antepartum   2. [redacted] weeks gestation of pregnancy    Reviewed with Dr. Parke Poisson in MFM  Plan: Discharge home Labor precautions and fetal kick counts reviewed Follow up with OB provider   Follow-up Information     [facility] Follow up.   Why: keep next scheduled appointment Contact information: [address] [place] [address] [phone]        [facility] at [facility] Follow up.   Specialty: Maternal and Fetal Medicine Why: they will call you with an appointment Contact information: [address], [address] [place] [address] [phone]                Allergies as of 12/11/2022       Reactions   Penicillins Hives        Medication List     TAKE these medications    acetaminophen 500 MG tablet Commonly known as: TYLENOL Take 1,000 mg by mouth every 6 (six) hours as needed for headache (pain).   albuterol 108 (90 Base) MCG/ACT inhaler Commonly known as: VENTOLIN HFA Inhale 2 puffs into the lungs every 6 (six) hours as needed for wheezing or shortness of breath.   aspirin EC 81 MG tablet Take 1 tablet (81 mg total) by mouth daily. Start taking when you are [redacted] weeks pregnant for rest of pregnancy for prevention of preeclampsia   Blood Pressure Kit Devi 1 kit by Does not apply route once a week. Check Blood Pressure regularly and record readings into the [employer] App.  Large Cuff.  DX O90.0   doxylamine (Sleep) 25 MG tablet Commonly known as: UNISOM Take 1 tablet (25 mg total) by mouth at bedtime.   famotidine 20 MG tablet Commonly known as: Pepcid Take 1 tablet (20 mg total) by mouth daily.   Gojji Weight Scale Misc 1 Device by Does not apply route once a week.   loratadine 10 MG tablet Commonly known as: CLARITIN Take 10 mg by mouth every evening.   ondansetron 4 MG disintegrating tablet Commonly known as: ZOFRAN-ODT Take 1 tablet (4 mg total) by mouth every 8 (eight) hours as needed for nausea.   pyridOXINE 25 MG tablet Commonly known as: VITAMIN B6 Take 1 tablet (25 mg total) by mouth in the morning, at noon, and at bedtime.        Reva Bores, MD 12/11/2022 6:26 PM

## 2022-12-11 NOTE — Procedures (Signed)
Kelsey Jenkins Jul 08, 2000 [redacted]w[redacted]d  Fetus A Non-Stress Test Interpretation for 12/11/22  Indication: IUGR  Fetal Heart Rate A Mode: External Baseline Rate (A): 145 bpm Variability: Moderate Accelerations: None Decelerations: Variable Multiple birth?: No  Uterine Activity Mode: Palpation, Toco Contraction Frequency (min): UI Contraction Quality: Mild Resting Tone Palpated: Relaxed Resting Time: Adequate  Interpretation (Fetal Testing) Nonstress Test Interpretation: Non-reactive Comments: Dr. Grace Bushy reviewed tracing. With NR tracing and decel noted, pt is advised to go to MAU for prolonged monitoring.

## 2022-12-14 ENCOUNTER — Ambulatory Visit: Payer: Medicaid Other | Attending: Obstetrics

## 2022-12-14 ENCOUNTER — Encounter: Payer: Self-pay | Admitting: *Deleted

## 2022-12-14 ENCOUNTER — Other Ambulatory Visit: Payer: Self-pay

## 2022-12-14 ENCOUNTER — Ambulatory Visit: Payer: Medicaid Other | Admitting: *Deleted

## 2022-12-14 ENCOUNTER — Ambulatory Visit (HOSPITAL_BASED_OUTPATIENT_CLINIC_OR_DEPARTMENT_OTHER): Payer: Medicaid Other | Admitting: *Deleted

## 2022-12-14 DIAGNOSIS — J45909 Unspecified asthma, uncomplicated: Secondary | ICD-10-CM | POA: Diagnosis not present

## 2022-12-14 DIAGNOSIS — O99280 Endocrine, nutritional and metabolic diseases complicating pregnancy, unspecified trimester: Secondary | ICD-10-CM | POA: Diagnosis not present

## 2022-12-14 DIAGNOSIS — E039 Hypothyroidism, unspecified: Secondary | ICD-10-CM

## 2022-12-14 DIAGNOSIS — Z3A27 27 weeks gestation of pregnancy: Secondary | ICD-10-CM

## 2022-12-14 DIAGNOSIS — O365920 Maternal care for other known or suspected poor fetal growth, second trimester, not applicable or unspecified: Secondary | ICD-10-CM | POA: Insufficient documentation

## 2022-12-14 DIAGNOSIS — O99810 Abnormal glucose complicating pregnancy: Secondary | ICD-10-CM

## 2022-12-14 DIAGNOSIS — O99282 Endocrine, nutritional and metabolic diseases complicating pregnancy, second trimester: Secondary | ICD-10-CM | POA: Diagnosis not present

## 2022-12-14 DIAGNOSIS — O99512 Diseases of the respiratory system complicating pregnancy, second trimester: Secondary | ICD-10-CM | POA: Diagnosis not present

## 2022-12-14 NOTE — Procedures (Signed)
Kelsey Jenkins [birth year] [redacted]w[redacted]d  Fetus A Non-Stress Test Interpretation for 12/14/22  Indication: IUGR and Hypothyroid  Fetal Heart Rate A Mode: External Baseline Rate (A): 140 bpm Variability: Minimal Accelerations: 10 x 10 Decelerations: None Multiple birth?: No  Uterine Activity Mode: Toco Contraction Frequency (min): None Resting Tone Palpated: Relaxed  Interpretation (Fetal Testing) Nonstress Test Interpretation: Reactive Overall Impression: Reassuring for gestational age Comments: Tracing reviewed by Dr. Judeth Cornfield

## 2022-12-17 NOTE — BH Specialist Note (Unsigned)
Integrated Behavioral Health via Telemedicine Visit  12/17/2022 Kelsey Jenkins [id number]  Number of Integrated Behavioral Health Clinician visits: No data recorded Session Start time: No data recorded  Session End time: No data recorded Total time in minutes: No data recorded  Referring Provider: *** Patient/Family location: Home*** [facility] Provider location: [facility]  All persons participating in visit: Patient Kelsey Jenkins and [name] ***  Types of Service: {CHL AMB TYPE OF SERVICE:[phone]}  I connected with Kelsey Jenkins and/or Kelsey Jenkins's {family members:20773} via  Telephone or Engineer, civil (consulting)  (Video is Caregility application) and verified that I am speaking with the correct person using two identifiers. Discussed confidentiality: Yes   I discussed the limitations of telemedicine and the availability of in person appointments.  Discussed there is a possibility of technology failure and discussed alternative modes of communication if that failure occurs.  I discussed that engaging in this telemedicine visit, they consent to the provision of behavioral healthcare and the services will be billed under their insurance.  Patient and/or legal guardian expressed understanding and consented to Telemedicine visit: Yes   Presenting Concerns: Patient and/or family reports the following symptoms/concerns: *** Duration of problem: ***; Severity of problem: {Mild/Moderate/Severe:20260}  Patient and/or Family's Strengths/Protective Factors: {CHL AMB BH PROTECTIVE FACTORS:[phone]}  Goals Addressed: Patient will:  Reduce symptoms of: {IBH Symptoms:21014056}   Increase knowledge and/or ability of: {IBH Patient Tools:21014057}   Demonstrate ability to: {IBH Goals:21014053}  Progress towards Goals: {CHL AMB BH PROGRESS TOWARDS GOALS:[phone]}  Interventions: Interventions  utilized:  {IBH Interventions:21014054} Standardized Assessments completed: {IBH Screening Tools:21014051}  Patient and/or Family Response: Patient agrees with treatment plan.   Assessment: Patient currently experiencing ***.   Patient may benefit from psychoeducation and brief therapeutic interventions regarding coping with symptoms of *** .  Plan: Follow up with behavioral health clinician on : *** Behavioral recommendations:  -*** -*** Referral(s): {IBH Referrals:21014055}  I discussed the assessment and treatment plan with the patient and/or parent/guardian. They were provided an opportunity to ask questions and all were answered. They agreed with the plan and demonstrated an understanding of the instructions.   They were advised to call back or seek an in-person evaluation if the symptoms worsen or if the condition fails to improve as anticipated.  [name], LCSW     10/06/2022    9:53 AM  Depression screen PHQ 2/9  Decreased Interest 0  Down, Depressed, Hopeless 2  PHQ - 2 Score 2  Altered sleeping 2  Tired, decreased energy 3  Change in appetite 3  Feeling bad or failure about yourself  0  Trouble concentrating 0  Moving slowly or fidgety/restless 0  Suicidal thoughts 0  PHQ-9 Score 10  Difficult doing work/chores Not difficult at all      10/06/2022    9:54 AM  GAD 7 : Generalized Anxiety Score  Nervous, Anxious, on Edge 0  Control/stop worrying 0  Worry too much - different things 0  Trouble relaxing 2  Restless 0  Easily annoyed or irritable 3  Afraid - awful might happen 0  Total GAD 7 Score 5  Anxiety Difficulty Not difficult at all

## 2022-12-22 ENCOUNTER — Other Ambulatory Visit: Payer: Self-pay | Admitting: *Deleted

## 2022-12-22 ENCOUNTER — Ambulatory Visit (HOSPITAL_BASED_OUTPATIENT_CLINIC_OR_DEPARTMENT_OTHER): Payer: Medicaid Other | Admitting: *Deleted

## 2022-12-22 ENCOUNTER — Other Ambulatory Visit: Payer: Self-pay

## 2022-12-22 ENCOUNTER — Ambulatory Visit: Payer: Medicaid Other | Attending: Maternal & Fetal Medicine

## 2022-12-22 DIAGNOSIS — Z3A28 28 weeks gestation of pregnancy: Secondary | ICD-10-CM | POA: Diagnosis not present

## 2022-12-22 DIAGNOSIS — O365930 Maternal care for other known or suspected poor fetal growth, third trimester, not applicable or unspecified: Secondary | ICD-10-CM

## 2022-12-22 DIAGNOSIS — Z3A29 29 weeks gestation of pregnancy: Secondary | ICD-10-CM | POA: Insufficient documentation

## 2022-12-22 DIAGNOSIS — O99513 Diseases of the respiratory system complicating pregnancy, third trimester: Secondary | ICD-10-CM

## 2022-12-22 DIAGNOSIS — O99280 Endocrine, nutritional and metabolic diseases complicating pregnancy, unspecified trimester: Secondary | ICD-10-CM | POA: Insufficient documentation

## 2022-12-22 DIAGNOSIS — O365920 Maternal care for other known or suspected poor fetal growth, second trimester, not applicable or unspecified: Secondary | ICD-10-CM | POA: Insufficient documentation

## 2022-12-22 DIAGNOSIS — J45909 Unspecified asthma, uncomplicated: Secondary | ICD-10-CM | POA: Diagnosis not present

## 2022-12-22 DIAGNOSIS — O99283 Endocrine, nutritional and metabolic diseases complicating pregnancy, third trimester: Secondary | ICD-10-CM | POA: Diagnosis not present

## 2022-12-22 DIAGNOSIS — O99810 Abnormal glucose complicating pregnancy: Secondary | ICD-10-CM | POA: Diagnosis not present

## 2022-12-22 DIAGNOSIS — E039 Hypothyroidism, unspecified: Secondary | ICD-10-CM

## 2022-12-22 NOTE — Procedures (Signed)
Kelsey Jenkins 07-Mar-2000 [redacted]w[redacted]d  Fetus A Non-Stress Test Interpretation for 11/12/24NST with UAD  Indication: IUGR  Fetal Heart Rate A Mode: External Baseline Rate (A): 140 bpm Variability: Moderate Accelerations: 10 x 10 Decelerations: None Multiple birth?: No  Uterine Activity Mode: Toco Contraction Frequency (min): none Resting Tone Palpated: Relaxed  Interpretation (Fetal Testing) Nonstress Test Interpretation: Reactive Comments: Tracing reviewed byDr. Judeth Cornfield

## 2022-12-23 NOTE — Procedures (Signed)
Kelsey Jenkins 2000-05-26 [redacted]w[redacted]d  Fetus A Non-Stress Test Interpretation for 12/23/22-NST with UAD  Indication: IUGR  Fetal Heart Rate A Mode: External Baseline Rate (A): 140 bpm Variability: Moderate Accelerations: 10 x 10 Decelerations: None Multiple birth?: No  Uterine Activity Mode: Toco Contraction Frequency (min): none Resting Tone Palpated: Relaxed  Interpretation (Fetal Testing) Nonstress Test Interpretation: Reactive Comments: Tracing reviewed byDr. Judeth Cornfield

## 2022-12-30 ENCOUNTER — Ambulatory Visit (HOSPITAL_BASED_OUTPATIENT_CLINIC_OR_DEPARTMENT_OTHER): Payer: Medicaid Other | Admitting: *Deleted

## 2022-12-30 ENCOUNTER — Ambulatory Visit: Payer: Medicaid Other | Attending: Obstetrics and Gynecology

## 2022-12-30 DIAGNOSIS — O99283 Endocrine, nutritional and metabolic diseases complicating pregnancy, third trimester: Secondary | ICD-10-CM | POA: Diagnosis not present

## 2022-12-30 DIAGNOSIS — O99280 Endocrine, nutritional and metabolic diseases complicating pregnancy, unspecified trimester: Secondary | ICD-10-CM

## 2022-12-30 DIAGNOSIS — O99810 Abnormal glucose complicating pregnancy: Secondary | ICD-10-CM | POA: Diagnosis not present

## 2022-12-30 DIAGNOSIS — E039 Hypothyroidism, unspecified: Secondary | ICD-10-CM | POA: Diagnosis not present

## 2022-12-30 DIAGNOSIS — O365930 Maternal care for other known or suspected poor fetal growth, third trimester, not applicable or unspecified: Secondary | ICD-10-CM | POA: Diagnosis not present

## 2022-12-30 DIAGNOSIS — O99513 Diseases of the respiratory system complicating pregnancy, third trimester: Secondary | ICD-10-CM | POA: Diagnosis not present

## 2022-12-30 DIAGNOSIS — J45909 Unspecified asthma, uncomplicated: Secondary | ICD-10-CM | POA: Diagnosis not present

## 2022-12-30 DIAGNOSIS — Z3A30 30 weeks gestation of pregnancy: Secondary | ICD-10-CM | POA: Diagnosis not present

## 2022-12-30 NOTE — Procedures (Signed)
Kelsey Jenkins 2000/12/30 [redacted]w[redacted]d  Fetus A Non-Stress Test Interpretation for 12/30/22-NST with UAD  Indication: IUGR  Fetal Heart Rate A Mode: External Baseline Rate (A): 140 bpm Variability: Moderate Accelerations: 10 x 10 Decelerations: None Multiple birth?: No  Uterine Activity Mode: Toco Contraction Frequency (min): none Resting Tone Palpated: Relaxed  Interpretation (Fetal Testing) Nonstress Test Interpretation: Reactive Comments: Tracing reviewed by Dr. Parke Poisson

## 2022-12-31 ENCOUNTER — Ambulatory Visit: Payer: Medicaid Other | Admitting: Clinical

## 2022-12-31 DIAGNOSIS — F4320 Adjustment disorder, unspecified: Secondary | ICD-10-CM

## 2022-12-31 NOTE — Patient Instructions (Signed)
Center for [facility] at [facility] for Women [place], [place] [address] [phone] (main office) [phone] [facility] office)  [email or web address]   Armed forces training and education officer  (Childcare options, Early childcare development, etc.) [email or web address]   [place]  [email or web address]        BRAINSTORMING  Develop a Plan Goals: Provide a way to start conversation about your new life with a baby Assist parents in recognizing and using resources within their reach Help pave the way before birth for an easier period of transition afterwards.  Make a list of the following information to keep in a central location: Full name of Mom and Partner: _____________________________________________ 89 full name and Date of Birth: ___________________________________________ Home Address: ___________________________________________________________ ________________________________________________________________________ Home Phone: ____________________________________________________________ Parents' cell numbers: _____________________________________________________ ________________________________________________________________________ Name and contact info for OB: ______________________________________________ Name and contact info for Pediatrician:________________________________________ Contact info for Lactation Consultants: ________________________________________  REST and SLEEP *You each need at least 4-5 hours of uninterrupted sleep every day. Write specific names and contact information.* How are you going to rest in the postpartum period? While partner's home? When partner returns to work? When you both return to work? Where will your baby sleep? Who is available to help during the day? Evening? Night? Who could move in for a period to help support you? What are  some ideas to help you get enough sleep? __________________________________________________________________________________________________________________________________________________________________________________________________________________________________________ NUTRITIOUS FOOD AND DRINK *Plan for meals before your baby is born so you can have healthy food to eat during the immediate postpartum period.* Who will look after breakfast? Lunch? Dinner? List names and contact information. Brainstorm quick, healthy ideas for each meal. What can you do before baby is born to prepare meals for the postpartum period? How can others help you with meals? Which grocery stores provide online shopping and delivery? Which restaurants offer take-out or delivery options? ______________________________________________________________________________________________________________________________________________________________________________________________________________________________________________________________________________________________________________________________________________________________________________________________________  CARE FOR MOM *It's important that mom is cared for and pampered in the postpartum period. Remember, the most important ways new mothers need care are: sleep, nutrition, gentle exercise, and time off.* Who can come take care of mom during this period? Make a list of people with their contact information. List some activities that make you feel cared for, rested, and energized? Who can make sure you have opportunities to do these things? Does mom have a space of her very own within your home that's just for her? Make a "[facility]" where she can be comfortable, rest, and renew herself  daily. ______________________________________________________________________________________________________________________________________________________________________________________________________________________________________________________________________________________________________________________________________________________________________________________________________    CARE FOR AND FEEDING BABY *Knowledgeable and encouraging people will offer the best support with regard to feeding your baby.* Educate yourself and choose the best feeding option for your baby. Make a list of people who will guide, support, and be a resource for you as your care for and feed your baby. (Friends that have breastfed or are currently breastfeeding, lactation consultants, breastfeeding support groups, etc.) Consider a postpartum doula. (These websites can give you information: dona.org & BuyingShow.es) Seek out local breastfeeding resources like the breastfeeding support group at [employer] or [employer]. ______________________________________________________________________________________________________________________________________________________________________________________________________________________________________________________________________________________________________________________________________________________________________________________________________  Verner Chol AND ERRANDS Who can help with a thorough cleaning before baby is born? Make a list of people who will help with housekeeping and chores, like laundry, light cleaning, dishes, bathrooms, etc. Who can run some errands for you? What can you do to make sure you are stocked with basic supplies before baby is born? Who is going to do the  shopping? ______________________________________________________________________________________________________________________________________________________________________________________________________________________________________________________________________________________________________________________________________________________________________________________________________  Family Adjustment *Nurture yourselves.it helps parents be more loving and allows for better bonding with their child.* What sorts of things do you and partner enjoy doing together? Which activities help you to connect and strengthen your relationship? Make a list of those things. Make a list of people whom you trust to care for your baby so you can have some time together as a couple. What types of things help partner feel connected to Mom? Make a list. What needs will partner have in order to bond with baby? Other children? Who will care for them when you go into labor and while you are in the hospital? Think about what the needs of your older children might be. Who can help you meet those needs? In what ways are you helping them prepare for bringing baby home? List some specific strategies you have for family adjustment. _______________________________________________________________________________________________________________________________________________________________________________________________________________________________________________________________________________________________________________________________________________  SUPPORT *Someone who can empathize with experiences normalizes your problems and makes them more bearable.* Make a list of other friends, neighbors, and/or co-workers you know with infants (and small children, if applicable) with whom you can connect. Make a list of local or online support groups, mom groups, etc. in which you can be  involved. ______________________________________________________________________________________________________________________________________________________________________________________________________________________________________________________________________________________________________________________________________________________________________________________________________  Childcare Plans Investigate and plan for childcare if mom is returning to work. Talk about mom's concerns about her transition back to work. Talk about partner's concerns regarding this transition.  Mental Health *Your mental health is one of the highest priorities for a pregnant or postpartum mom.* 1 in 5 women experience anxiety and/or depression from the time of conception through the first year after birth. Postpartum Mood Disorders are the #1 complication of pregnancy and childbirth and the suffering experienced by these mothers is not necessary! These illnesses are temporary and respond well to treatment, which often includes self-care, social support, talk therapy, and medication when needed. Women experiencing anxiety and depression often say things like: "I'm supposed to be happy.why do I feel so sad?", "Why can't I snap out of it?", "I'm having thoughts that scare me." There is no need to be embarrassed if you are feeling these symptoms: Overwhelmed, anxious, angry, sad, guilty, irritable, hopeless, exhausted but can't sleep You are NOT alone. You are NOT to blame. With help, you WILL be well. Where can I find help? Medical professionals such as your OB, midwife, gynecologist, family practitioner, primary care provider, pediatrician, or mental health providers; [facility] support groups: Feelings After Birth, Breastfeeding Support Group, Baby and Me Group, and Fit 4 Two exercise classes. You have permission to ask for help. It will confirm your feelings, validate your experiences,  share/learn coping strategies, and gain support and encouragement as you heal. You are important! BRAINSTORM Make a list of local resources, including resources for mom and for partner. Identify support groups. Identify people to call late at night - include names and contact info. Talk with partner about perinatal mood and anxiety disorders. Talk with your OB, midwife, and doula about baby blues and about perinatal mood and anxiety disorders. Talk with your pediatrician about perinatal mood and anxiety disorders.   Support & Sanity Savers   What do you really need?  Basics In preparing for a new baby, many expectant parents spend hours shopping for baby clothes, decorating the nursery, and deciding which car seat to buy. Yet most don't think much about what the reality of parenting a newborn will be like, and what they need to make it through that. So, here is the advice of  experienced parents. We know you'll read this, and think "they're exaggerating, I don't really need that." Just trust [place] on these, OK? Plan for all of this, and if it turns out you don't need it, come back and teach [place] how you did it!  Must-Haves (Once baby's survival needs are met, make sure you attend to your own survival needs!) Sleep An average newborn sleeps 16-18 hours per day, over 6-7 sleep periods, rarely more than three hours at a time. It is normal and healthy for a newborn to wake throughout the night... but really hard on parents!! Naps. Prioritize sleep above any responsibilities like: cleaning house, visiting friends, running errands, etc.  Sleep whenever baby sleeps. If you can't nap, at least have restful times when baby eats. The more rest you get, the more patient you will be, the more emotionally stable, and better at solving problems.  Food You may not have realized it would be difficult to eat when you have a newborn. Yet, when we talk to countless new parents, they say things like "it may be 2:00 pm  when I realize I haven't had breakfast yet." Or "every time we sit down to dinner, baby needs to eat, and my food gets cold, so I don't bother to eat it." Finger food. Before your baby is born, stock up with one months' worth of food that: 1) you can eat with one hand while holding a baby, 2) doesn't need to be prepped, 3) is good hot or cold, 4) doesn't spoil when left out for a few hours, and 5) you like to eat. Think about: nuts, dried fruit, Clif bars, pretzels, jerky, gogurt, baby carrots, apples, bananas, crackers, cheez-n-crackers, string cheese, hot pockets or frozen burritos to microwave, garden burgers and breakfast pastries to put in the toaster, yogurt drinks, etc. Restaurant Menus. Make lists of your favorite restaurants & menu items. When family/friends want to help, you can give specific information without much thought. They can either bring you the food or send gift cards for just the right meals. Freezer Meals.  Take some time to make a few meals to put in the freezer ahead of time.  Easy to freeze meals can be anything such as soup, lasagna, chicken pie, or spaghetti sauce. Set up a Meal Schedule.  Ask friends and family to sign up to bring you meals during the first few weeks of being home. (It can be passed around at baby showers!) You have no idea how helpful this will be until you are in the throes of parenting.  https://hamilton-woodard.[email or web address]/ is a great website to check out. Emotional Support Know who to call when you're stressed out. Parenting a newborn is very challenging work. There are times when it totally overwhelms your normal coping abilities. EVERY NEW PARENT NEEDS TO HAVE A PLAN FOR WHO TO CALL WHEN THEY JUST CAN'T COPE ANY MORE. (And it has to be someone other than the baby's other parent!) Before your baby is born, come up with at least one person you can call for support - write their phone number down and post it on the refrigerator. Anxiety & Sadness. Baby blues are normal after  pregnancy; however, there are more severe types of anxiety & sadness which can occur and should not be ignored.  They are always treatable, but you have to take the first step by reaching out for help. [facility] offers a "Mom Talk" group which meets every Tuesday from 10 am - 11 am.  This group is for new moms who need support and connection after their babies are born.  Call [phone].  Really, Really Helpful (Plan for them! Make sure these happen often!!) Physical Support with Taking Care of Yourselves Asking friends and family. Before your baby is born, set up a schedule of people who can come and visit and help out (or ask a friend to schedule for you). Any time someone says "let me know what I can do to help," sign them up for a day. When they get there, their job is not to take care of the baby (that's your job and your joy). Their job is to take care of you!  Postpartum doulas. If you don't have anyone you can call on for support, look into postpartum doulas:  professionals at helping parents with caring for baby, caring for themselves, getting breastfeeding started, and helping with household tasks. www.padanc.org is a helpful website for learning about doulas in our area. Peer Support / Parent Groups Why: One of the greatest ideas for new parents is to be around other new parents. Parent groups give you a chance to share and listen to others who are going through the same season of life, get a sense of what is normal infant development by watching several babies learn and grow, share your stories of triumph and struggles with empathetic ears, and forgive your own mistakes when you realize all parents are learning by trial and error. Where to find: There are many places you can meet other new parents throughout our community.  [facility] offers the following classes for new moms and their little ones:  Baby and Me (Birth to [place]) and Breastfeeding Support Group. Go to  www.[email or web address] or call [phone] for more information. Time for your Relationship It's easy to get so caught up in meeting baby's immediate needs that it's hard to find time to connect with your partner, and meet the needs of your relationship. It's also easy to forget what "quality time with your partner" actually looks like. If you take your baby on a date, you'd be amazed how much of your couple time is spent feeding the baby, diapering the baby, admiring the baby, and talking about the baby. Dating: Try to take time for just the two of you. Babysitter tip: Sometimes when moms are breastfeeding a newborn, they find it hard to figure out how to schedule outings around baby's unpredictable feeding schedules. Have the babysitter come for a three hour period. When she comes over, if baby has just eaten, you can leave right away, and come back in two hours. If baby hasn't fed recently, you start the date at home. Once baby gets hungry and gets a good feeding in, you can head out for the rest of your date time. Date Nights at Home: If you can't get out, at least set aside one evening a week to prioritize your relationship: whenever baby dozes off or doesn't have any immediate needs, spend a little time focusing on each other. Potential conflicts: The main relationship conflicts that come up for new parents are: issues related to sexuality, financial stresses, a feeling of an unfair division of household tasks, and conflicts in parenting styles. The more you can work on these issues before baby arrives, the better!  Fun and Frills (Don't forget these. and don't feel guilty for indulging in them!) Everyone has something in life that is a fun little treat that they do just for themselves. It may be: reading  the morning paper, or going for a daily jog, or having coffee with a friend once a week, or going to a movie on Friday nights, or fine chocolates, or bubble baths, or curling up with a good  book. Unless you do fun things for yourself every now and then, it's hard to have the energy for fun with your baby. Whatever your "special" treats are, make sure you find a way to continue to indulge in them after your baby is born. These special moments can recharge you, and allow you to return to baby with a new joy   PERINATAL MOOD DISORDERS: [place]   _________________________________________Emergency and Crisis Resources If you are an imminent risk to self or others, are experiencing intense personal distress, and/or have noticed significant changes in activities of daily living, call:  [place]: [phone]  [address], [place], [place], [address] Mobile Crisis: [place]: [phone] Or visit the following crisis centers: Local Emergency Departments [facility]: [address] [place]. Hours: 8:30AM-5PM. Insurance Accepted: Medicaid, Medicare, and Uninsured.  [facility]:  [address], [place]  Mon-Friday 8am-3pm, [phone]                                                                                  ___________ Non-Crisis Resources To identify specific providers that are covered by your insurance, contact your insurance company or local agencies:  [place]: [phone] [facility] [employer]: [place]: [phone] Postpartum Support International- Warm-line: 325-195-0817                                                      __Outpatient Therapy and Medication Management   Providers:  [facility]: [phone] Hours: 9AM-5PM  Insurance Accepted: Alben Spittle, Shane Crutch, [place], [place] [facility]): [phone] Hours: 8AM-5:30PM  nsurance Accepted: All insurances EXCEPT AARP, [place],  [place], and [place]: [phone] Hours: 8AM-8PM Insurance Accepted: Cristal Ford, Freddrick March, [place], Medicare, [phone] [facility]: [phone] Hours: 8:30AM-7PM Insurance Accepted: Cristal Ford, Medicaid, Medicare, Tricare, [employer] Counseling:  [place] Accepted:  Holland Falling, Lorella Nimrod, [employer], [place]: [phone] Hours: 9AM-5:30PM Insurance Accepted: Alben Spittle, Charlotte Crumb, and Medicaid, Medicare, [facility]:  [phone] Hours: 9am-5pm Insurance Accepted: BCBS; they do not accept Medicaid/Medicare [facility] [place]: [phone] Hours: 9am-9pm Insurance Accepted: All major insurance including Medicaid and Medicare [facility]: [phone] Hours: [place] Accepted: All insurances EXCEPT Medicaid and Medicare. [facility]: [phone]   ____________  Parenting Support Groups [facility]: [phone] [facility]:  [phone] Family Support Network: (support for children in the NICU and/or with special needs), [phone]   ___________                                                                 Mental Health Support Groups Mental Health Association: [phone]    _____________                                                                                  Online Resources Postpartum Support International: http://www.postpartum.net/  800-944-4PPD 2Moms Supporting Moms:  www.momssupportingmoms.net    

## 2023-01-04 ENCOUNTER — Encounter: Payer: Medicaid Other | Admitting: Advanced Practice Midwife

## 2023-01-04 ENCOUNTER — Other Ambulatory Visit: Payer: Medicaid Other

## 2023-01-05 ENCOUNTER — Other Ambulatory Visit: Payer: Medicaid Other

## 2023-01-06 ENCOUNTER — Ambulatory Visit: Payer: Medicaid Other | Admitting: *Deleted

## 2023-01-06 ENCOUNTER — Other Ambulatory Visit: Payer: Self-pay | Admitting: *Deleted

## 2023-01-06 ENCOUNTER — Ambulatory Visit: Payer: Medicaid Other | Attending: Obstetrics and Gynecology

## 2023-01-06 ENCOUNTER — Other Ambulatory Visit: Payer: Self-pay

## 2023-01-06 VITALS — BP 134/95

## 2023-01-06 DIAGNOSIS — J45909 Unspecified asthma, uncomplicated: Secondary | ICD-10-CM

## 2023-01-06 DIAGNOSIS — O365930 Maternal care for other known or suspected poor fetal growth, third trimester, not applicable or unspecified: Secondary | ICD-10-CM

## 2023-01-06 DIAGNOSIS — Z3A31 31 weeks gestation of pregnancy: Secondary | ICD-10-CM

## 2023-01-06 DIAGNOSIS — O99280 Endocrine, nutritional and metabolic diseases complicating pregnancy, unspecified trimester: Secondary | ICD-10-CM | POA: Insufficient documentation

## 2023-01-06 DIAGNOSIS — O99513 Diseases of the respiratory system complicating pregnancy, third trimester: Secondary | ICD-10-CM

## 2023-01-06 DIAGNOSIS — O99283 Endocrine, nutritional and metabolic diseases complicating pregnancy, third trimester: Secondary | ICD-10-CM | POA: Diagnosis not present

## 2023-01-06 DIAGNOSIS — O365920 Maternal care for other known or suspected poor fetal growth, second trimester, not applicable or unspecified: Secondary | ICD-10-CM | POA: Diagnosis not present

## 2023-01-06 DIAGNOSIS — E039 Hypothyroidism, unspecified: Secondary | ICD-10-CM | POA: Insufficient documentation

## 2023-01-06 DIAGNOSIS — O365990 Maternal care for other known or suspected poor fetal growth, unspecified trimester, not applicable or unspecified: Secondary | ICD-10-CM

## 2023-01-06 DIAGNOSIS — O99810 Abnormal glucose complicating pregnancy: Secondary | ICD-10-CM

## 2023-01-06 NOTE — Procedures (Signed)
Kelsey Jenkins 06-Aug-2000 [redacted]w[redacted]d  Fetus A Non-Stress Test Interpretation for 01/06/23-NST with limited  Indication: IUGR  Fetal Heart Rate A Mode: External Baseline Rate (A): 135 bpm Variability: Moderate Accelerations: 10 x 10 Decelerations: None Multiple birth?: No  Uterine Activity Mode: Toco Contraction Frequency (min): none Resting Tone Palpated: Relaxed  Interpretation (Fetal Testing) Nonstress Test Interpretation: Reactive Comments: Tracing reviewed byDr.Booker

## 2023-01-14 ENCOUNTER — Ambulatory Visit: Payer: Medicaid Other | Admitting: *Deleted

## 2023-01-14 ENCOUNTER — Other Ambulatory Visit: Payer: Self-pay

## 2023-01-14 ENCOUNTER — Ambulatory Visit: Payer: Medicaid Other | Attending: Obstetrics and Gynecology

## 2023-01-14 DIAGNOSIS — Z3A32 32 weeks gestation of pregnancy: Secondary | ICD-10-CM

## 2023-01-14 DIAGNOSIS — O99283 Endocrine, nutritional and metabolic diseases complicating pregnancy, third trimester: Secondary | ICD-10-CM

## 2023-01-14 DIAGNOSIS — O365930 Maternal care for other known or suspected poor fetal growth, third trimester, not applicable or unspecified: Secondary | ICD-10-CM

## 2023-01-14 DIAGNOSIS — O365990 Maternal care for other known or suspected poor fetal growth, unspecified trimester, not applicable or unspecified: Secondary | ICD-10-CM | POA: Diagnosis not present

## 2023-01-14 DIAGNOSIS — O99810 Abnormal glucose complicating pregnancy: Secondary | ICD-10-CM | POA: Diagnosis not present

## 2023-01-14 DIAGNOSIS — J45909 Unspecified asthma, uncomplicated: Secondary | ICD-10-CM

## 2023-01-14 NOTE — Procedures (Signed)
Kelsey Jenkins [birth year] [redacted]w[redacted]d  Fetus A Non-Stress Test Interpretation for 01/14/23-NST with BPP and UAD  Indication: IUGR  Fetal Heart Rate A Mode: External Baseline Rate (A): 135 bpm Variability: Moderate Accelerations: 15 x 15 Decelerations: Variable Multiple birth?: No  Uterine Activity Mode: Toco Contraction Frequency (min): occas Contraction Duration (sec): 50-70 Contraction Quality: Mild (not felt by pt) Resting Tone Palpated: Relaxed  Interpretation (Fetal Testing) Nonstress Test Interpretation: Reactive Comments: Tracing reviewed by Dr. Parke Poisson

## 2023-01-15 ENCOUNTER — Other Ambulatory Visit: Payer: Self-pay | Admitting: Maternal & Fetal Medicine

## 2023-01-15 DIAGNOSIS — O365990 Maternal care for other known or suspected poor fetal growth, unspecified trimester, not applicable or unspecified: Secondary | ICD-10-CM

## 2023-01-21 ENCOUNTER — Ambulatory Visit: Payer: Medicaid Other | Attending: Obstetrics and Gynecology

## 2023-01-21 ENCOUNTER — Ambulatory Visit: Payer: Medicaid Other | Admitting: *Deleted

## 2023-01-21 ENCOUNTER — Other Ambulatory Visit: Payer: Self-pay | Admitting: *Deleted

## 2023-01-21 DIAGNOSIS — E039 Hypothyroidism, unspecified: Secondary | ICD-10-CM

## 2023-01-21 DIAGNOSIS — O365990 Maternal care for other known or suspected poor fetal growth, unspecified trimester, not applicable or unspecified: Secondary | ICD-10-CM

## 2023-01-21 DIAGNOSIS — O365930 Maternal care for other known or suspected poor fetal growth, third trimester, not applicable or unspecified: Secondary | ICD-10-CM | POA: Insufficient documentation

## 2023-01-21 DIAGNOSIS — J45909 Unspecified asthma, uncomplicated: Secondary | ICD-10-CM | POA: Diagnosis not present

## 2023-01-21 DIAGNOSIS — O99283 Endocrine, nutritional and metabolic diseases complicating pregnancy, third trimester: Secondary | ICD-10-CM | POA: Diagnosis not present

## 2023-01-21 DIAGNOSIS — Z3A33 33 weeks gestation of pregnancy: Secondary | ICD-10-CM | POA: Insufficient documentation

## 2023-01-21 DIAGNOSIS — R7303 Prediabetes: Secondary | ICD-10-CM | POA: Insufficient documentation

## 2023-01-21 DIAGNOSIS — O99513 Diseases of the respiratory system complicating pregnancy, third trimester: Secondary | ICD-10-CM | POA: Diagnosis not present

## 2023-01-21 DIAGNOSIS — O99810 Abnormal glucose complicating pregnancy: Secondary | ICD-10-CM

## 2023-01-21 NOTE — Procedures (Signed)
Kelsey Jenkins Aug 11, 2000 [redacted]w[redacted]d  Fetus A Non-Stress Test Interpretation for 12/12/24NST with BPP  Indication: IUGR  Fetal Heart Rate A Mode: External Baseline Rate (A): 135 bpm Variability: Moderate Accelerations: 15 x 15 Decelerations: None Multiple birth?: No  Uterine Activity Mode: Toco Contraction Frequency (min): irreg Contraction Duration (sec): 40-60 Contraction Quality: Mild (none were felt by pt) Resting Tone Palpated: Relaxed  Interpretation (Fetal Testing) Nonstress Test Interpretation: Reactive Comments: Tracing reviewed byDr. Darra Lis

## 2023-01-27 ENCOUNTER — Ambulatory Visit: Payer: Medicaid Other

## 2023-01-29 NOTE — BH Specialist Note (Deleted)
Integrated Behavioral Health via Telemedicine Visit  01/29/2023 Kelsey Jenkins [id number]  Number of Integrated Behavioral Health Clinician visits: 1- Initial Visit  Session Start time: 0815   Session End time: 0858  Total time in minutes: 43   Referring Provider: Sharen Counter, CNM Patient/Family location: Home*** [facility] Provider location: [facility]  All persons participating in visit: Patient Kelsey Jenkins and [facility] ***  Types of Service: {CHL AMB TYPE OF [phone]}  I connected with Virl Diamond and/or Maranatha Finan's {family members:20773} via  Telephone or Engineer, civil (consulting)  (Video is Caregility application) and verified that I am speaking with the correct person using two identifiers. Discussed confidentiality: Yes   I discussed the limitations of telemedicine and the availability of in person appointments.  Discussed there is a possibility of technology failure and discussed alternative modes of communication if that failure occurs.  I discussed that engaging in this telemedicine visit, they consent to the provision of behavioral healthcare and the services will be billed under their insurance.  Patient and/or legal guardian expressed understanding and consented to Telemedicine visit: Yes   Presenting Concerns: Patient and/or family reports the following symptoms/concerns: *** Duration of problem: ***; Severity of problem: {Mild/Moderate/Severe:20260}  Patient and/or Family's Strengths/Protective Factors: {CHL AMB BH PROTECTIVE FACTORS:[phone]}  Goals Addressed: Patient will:  Reduce symptoms of: {IBH Symptoms:21014056}   Increase knowledge and/or ability of: {IBH Patient Tools:21014057}   Demonstrate ability to: {IBH Goals:21014053}  Progress towards Goals: {CHL AMB BH PROGRESS TOWARDS [phone]}  Interventions: Interventions utilized:  {IBH  Interventions:21014054} Standardized Assessments completed: {IBH Screening Tools:21014051}  Patient and/or Family Response: Patient agrees with treatment plan.   Assessment: Patient currently experiencing ***.   Patient may benefit from continued therapeutic intervention *** .  Plan: Follow up with behavioral health clinician on : *** Behavioral recommendations:  -*** -*** Referral(s): {IBH Referrals:21014055}  I discussed the assessment and treatment plan with the patient and/or parent/guardian. They were provided an opportunity to ask questions and all were answered. They agreed with the plan and demonstrated an understanding of the instructions.   They were advised to call back or seek an in-person evaluation if the symptoms worsen or if the condition fails to improve as anticipated.  [name], LCSW     12/31/2022    8:32 AM 10/06/2022    9:53 AM  Depression screen PHQ 2/9  Decreased Interest 2 0  Down, Depressed, Hopeless 1 2  PHQ - 2 Score 3 2  Altered sleeping 0 2  Tired, decreased energy 1 3  Change in appetite 0 3  Feeling bad or failure about yourself  0 0  Trouble concentrating 0 0  Moving slowly or fidgety/restless 0 0  Suicidal thoughts 0 0  PHQ-9 Score 4 10  Difficult doing work/chores  Not difficult at all      12/31/2022    8:35 AM 10/06/2022    9:54 AM  GAD 7 : Generalized Anxiety Score  Nervous, Anxious, on Edge 1 0  Control/stop worrying 0 0  Worry too much - different things 2 0  Trouble relaxing 0 2  Restless 0 0  Easily annoyed or irritable 1 3  Afraid - awful might happen 1 0  Total GAD 7 Score 5 5  Anxiety Difficulty  Not difficult at all

## 2023-02-01 ENCOUNTER — Ambulatory Visit: Payer: Medicaid Other | Attending: Obstetrics and Gynecology | Admitting: *Deleted

## 2023-02-01 ENCOUNTER — Ambulatory Visit: Payer: Medicaid Other

## 2023-02-01 DIAGNOSIS — Z3A34 34 weeks gestation of pregnancy: Secondary | ICD-10-CM | POA: Insufficient documentation

## 2023-02-01 DIAGNOSIS — E039 Hypothyroidism, unspecified: Secondary | ICD-10-CM | POA: Diagnosis not present

## 2023-02-01 DIAGNOSIS — O365930 Maternal care for other known or suspected poor fetal growth, third trimester, not applicable or unspecified: Secondary | ICD-10-CM | POA: Diagnosis not present

## 2023-02-01 DIAGNOSIS — O99283 Endocrine, nutritional and metabolic diseases complicating pregnancy, third trimester: Secondary | ICD-10-CM | POA: Diagnosis not present

## 2023-02-01 NOTE — Procedures (Addendum)
Kelsey Jenkins Sep 13, 2000 [redacted]w[redacted]d  Fetus A Non-Stress Test Interpretation for 02/01/23  Indication: IUGR and Hypothyroid  Fetal Heart Rate A Mode: External Baseline Rate (A): 130 bpm Variability: Moderate Accelerations: 15 x 15 Decelerations: None Multiple birth?: No  Uterine Activity Mode: Toco Contraction Frequency (min): None Resting Tone Palpated: Relaxed  Interpretation (Fetal Testing) Nonstress Test Interpretation: Reactive Comments: Tracing reviewed by Dr. Judeth Cornfield  MFM Note Patient could not stay for ultrasound because of another appointment. NST is reactive.

## 2023-02-06 ENCOUNTER — Inpatient Hospital Stay (HOSPITAL_COMMUNITY)
Admission: EM | Admit: 2023-02-06 | Discharge: 2023-02-06 | Disposition: A | Discharge status: Home / Self Care | Payer: Medicaid Other | Attending: Student in an Organized Health Care Education/Training Program | Admitting: Student in an Organized Health Care Education/Training Program

## 2023-02-06 ENCOUNTER — Encounter (HOSPITAL_COMMUNITY): Payer: Self-pay

## 2023-02-06 ENCOUNTER — Other Ambulatory Visit: Payer: Self-pay

## 2023-02-06 DIAGNOSIS — O26893 Other specified pregnancy related conditions, third trimester: Secondary | ICD-10-CM | POA: Diagnosis not present

## 2023-02-06 DIAGNOSIS — O99283 Endocrine, nutritional and metabolic diseases complicating pregnancy, third trimester: Secondary | ICD-10-CM | POA: Diagnosis not present

## 2023-02-06 DIAGNOSIS — E039 Hypothyroidism, unspecified: Secondary | ICD-10-CM | POA: Diagnosis not present

## 2023-02-06 DIAGNOSIS — Z3A35 35 weeks gestation of pregnancy: Secondary | ICD-10-CM | POA: Insufficient documentation

## 2023-02-06 DIAGNOSIS — O365930 Maternal care for other known or suspected poor fetal growth, third trimester, not applicable or unspecified: Secondary | ICD-10-CM | POA: Insufficient documentation

## 2023-02-06 DIAGNOSIS — R030 Elevated blood-pressure reading, without diagnosis of hypertension: Secondary | ICD-10-CM | POA: Diagnosis not present

## 2023-02-06 DIAGNOSIS — O4703 False labor before 37 completed weeks of gestation, third trimester: Secondary | ICD-10-CM

## 2023-02-06 DIAGNOSIS — Z7982 Long term (current) use of aspirin: Secondary | ICD-10-CM | POA: Insufficient documentation

## 2023-02-06 DIAGNOSIS — O365931 Maternal care for other known or suspected poor fetal growth, third trimester, fetus 1: Secondary | ICD-10-CM

## 2023-02-06 LAB — GROUP B STREP BY PCR: Group B strep by PCR: NEGATIVE

## 2023-02-06 LAB — BASIC METABOLIC PANEL
Anion gap: 7 (ref 5–15)
BUN: 13 mg/dL (ref 6–20)
CO2: 23 mmol/L (ref 22–32)
Calcium: 8.9 mg/dL (ref 8.9–10.3)
Chloride: 105 mmol/L (ref 98–111)
Creatinine, Ser: 0.77 mg/dL (ref 0.44–1.00)
GFR, Estimated: >60 mL/min (ref >60)
Glucose, Bld: 73 mg/dL (ref 70–99)
Potassium: 3.7 mmol/L (ref 3.5–5.1)
Sodium: 135 mmol/L (ref 135–145)

## 2023-02-06 LAB — URINALYSIS, ROUTINE W REFLEX MICROSCOPIC
Bilirubin Urine: NEGATIVE
Glucose, UA: NEGATIVE mg/dL
Hgb urine dipstick: NEGATIVE
Ketones, ur: NEGATIVE mg/dL
Leukocytes,Ua: NEGATIVE
Nitrite: NEGATIVE
Protein, ur: NEGATIVE mg/dL
Specific Gravity, Urine: 1.004 — ABNORMAL LOW (ref 1.005–1.030)
pH: 7 (ref 5.0–8.0)

## 2023-02-06 LAB — CBC
HCT: 38.5 % (ref 36.0–46.0)
Hemoglobin: 12.9 g/dL (ref 12.0–15.0)
MCH: 32.7 pg (ref 26.0–34.0)
MCHC: 33.5 g/dL (ref 30.0–36.0)
MCV: 97.5 fL (ref 80.0–100.0)
Platelets: 249 10*3/uL (ref 150–400)
RBC: 3.95 MIL/uL (ref 3.87–5.11)
RDW: 13.1 % (ref 11.5–15.5)
WBC: 9.5 10*3/uL (ref 4.0–10.5)
nRBC: 0 % (ref 0.0–0.2)

## 2023-02-06 LAB — PROTEIN / CREATININE RATIO, URINE
Creatinine, Urine: 52 mg/dL
Protein Creatinine Ratio: 0.15 mg/mg{creat} (ref 0.00–0.15)
Total Protein, Urine: 8 mg/dL

## 2023-02-06 LAB — COMPREHENSIVE METABOLIC PANEL
ALT: 12 U/L (ref 0–44)
AST: 19 U/L (ref 15–41)
Albumin: 2.6 g/dL — ABNORMAL LOW (ref 3.5–5.0)
Alkaline Phosphatase: 172 U/L — ABNORMAL HIGH (ref 38–126)
Anion gap: 8 (ref 5–15)
BUN: 8 mg/dL (ref 6–20)
CO2: 21 mmol/L — ABNORMAL LOW (ref 22–32)
Calcium: 8.7 mg/dL — ABNORMAL LOW (ref 8.9–10.3)
Chloride: 107 mmol/L (ref 98–111)
Creatinine, Ser: 0.72 mg/dL (ref 0.44–1.00)
GFR, Estimated: >60 mL/min (ref >60)
Glucose, Bld: 73 mg/dL (ref 70–99)
Potassium: 3.9 mmol/L (ref 3.5–5.1)
Sodium: 136 mmol/L (ref 135–145)
Total Bilirubin: 0.4 mg/dL (ref <1.2)
Total Protein: 6.1 g/dL — ABNORMAL LOW (ref 6.5–8.1)

## 2023-02-06 LAB — TROPONIN I (HIGH SENSITIVITY): Troponin I (High Sensitivity): 4 ng/L (ref <18)

## 2023-02-06 LAB — WET PREP, GENITAL
Clue Cells Wet Prep HPF POC: NONE SEEN
Sperm: NONE SEEN
Trich, Wet Prep: NONE SEEN
WBC, Wet Prep HPF POC: <10 (ref <10)
Yeast Wet Prep HPF POC: NONE SEEN

## 2023-02-06 MED ORDER — LACTATED RINGERS IV BOLUS
1000.0000 mL | Freq: Once | INTRAVENOUS | Status: AC
  Administered 2023-02-06: 1000 mL via INTRAVENOUS

## 2023-02-06 NOTE — Discharge Instructions (Signed)
Return precautions reviewed. Keep scheduled appointments

## 2023-02-06 NOTE — Progress Notes (Signed)
Pt says her pain is a 7 on a scale of 1-10. She does grimace with uc's but she is talking and laughing with her friend on her phone.

## 2023-02-06 NOTE — ED Notes (Addendum)
Report given to Alfredia Client with [facility]

## 2023-02-06 NOTE — Progress Notes (Signed)
Spoke with Dr. Debroah Loop. Pt is a G1P0 at 35 3/[redacted] weeks gestation presenting with c/o N&V & diarreah since last night. This pregnancy is complicated by IUGR and hypothyroidism. No vaginal bleeding or leaking of fluid. The pt is having uc's every 2-4 min that she feels. FHR is reactive. Dr.Arnold wants her transferred to MAU for further evaluation. ED staff and MAU notified.

## 2023-02-06 NOTE — ED Triage Notes (Signed)
Pt [redacted] wks pregnant. Reports pain in the left pevlic/hip area, and chest pain. Pt does report throwing up this morning, concerned that her potassium may be low. States that her chest has hurt in the past when potassium levels get low.

## 2023-02-06 NOTE — Progress Notes (Signed)
Pt is a G1P0 at 35 3/[redacted] weeks gestation presenting with c/o N&V, diarrhea since last night She gets her care at [facility]. This pregnancy is complicated by IUGR and hypothyroidism.

## 2023-02-06 NOTE — ED Notes (Signed)
Rapid OB has been called for pt.

## 2023-02-06 NOTE — ED Notes (Signed)
[facility] has been called for transport.  

## 2023-02-06 NOTE — ED Provider Notes (Incomplete)
[place] EMERGENCY DEPARTMENT AT [facility] Provider Note   CSN: [id number] Arrival date & time: 02/06/23  4098     History {Add pertinent medical, surgical, social history, OB history to HPI:1} Chief Complaint  Patient presents with  . Pelvic Pain  . Chest Pain    Kelsey Jenkins is a 22 y.o. female.  22 year old female G1P0 presenting to the emergency department with vomiting, intermittent chest discomfort, and left-sided pelvic discomfort.  The patient is approximately 35 weeks 3 days pregnant.  She reports vomiting overnight that led to intermittent chest discomfort.  She denies any current chest discomfort.  She reports waking up this morning with left-sided pelvic pain that comes and goes.  She denies any vaginal bleeding or discharge.  Patient unable to tell me her last OB appointment or her next scheduled OB appointment.  She follows with Dr. Scheryl Darter. Chart review shows she saw MAU obstetrics on 11/1.  She reports that she has had ongoing prenatal care.     Pelvic Pain Associated symptoms include chest pain.  Chest Pain      Home Medications Prior to Admission medications   Medication Sig Start Date End Date Taking? Authorizing Provider  acetaminophen (TYLENOL) 500 MG tablet Take 1,000 mg by mouth every 6 (six) hours as needed for headache (pain).    [provider]  albuterol (VENTOLIN HFA) 108 (90 Base) MCG/ACT inhaler Inhale 2 puffs into the lungs every 6 (six) hours as needed for wheezing or shortness of breath.    [provider]  aspirin EC 81 MG tablet Take 1 tablet (81 mg total) by mouth daily. Start taking when you are [redacted] weeks pregnant for rest of pregnancy for prevention of preeclampsia 10/06/22   Lennart Pall, MD  Blood Pressure Monitoring (BLOOD PRESSURE KIT) DEVI 1 kit by Does not apply route once a week. Check Blood Pressure regularly and record readings into the Babyscripts App.  Large Cuff.  DX O90.0 10/06/22    Lennart Pall, MD  doxylamine, Sleep, (UNISOM) 25 MG tablet Take 1 tablet (25 mg total) by mouth at bedtime. 10/06/22   Lennart Pall, MD  famotidine (PEPCID) 20 MG tablet Take 1 tablet (20 mg total) by mouth daily. 10/06/22   Lennart Pall, MD  loratadine (CLARITIN) 10 MG tablet Take 10 mg by mouth every evening. 04/28/21   [provider]  Misc. Devices (GOJJI WEIGHT SCALE) MISC 1 Device by Does not apply route once a week. 10/06/22   Lennart Pall, MD  ondansetron (ZOFRAN-ODT) 4 MG disintegrating tablet Take 1 tablet (4 mg total) by mouth every 8 (eight) hours as needed for nausea. 09/08/21   Arthor Captain, PA-C  pyridOXINE (VITAMIN B6) 25 MG tablet Take 1 tablet (25 mg total) by mouth in the morning, at noon, and at bedtime. 10/06/22   Lennart Pall, MD      Allergies    Penicillins    Review of Systems   Review of Systems  Cardiovascular:  Positive for chest pain.  Genitourinary:  Positive for pelvic pain.  All other systems reviewed and are negative.   Physical Exam Updated Vital Signs BP 122/74 (BP Location: Left Arm)   Pulse (!) 57   Temp 97.7 F (36.5 C) (Oral)   Resp 16   Ht 5\' 3"  (1.6 m)   Wt 70.3 kg   LMP 06/03/2022   SpO2 99%   BMI 27.46 kg/m  Physical Exam Vitals and nursing note  reviewed.  HENT:     Head: Normocephalic.  Cardiovascular:     Rate and Rhythm: Normal rate.     Heart sounds: Normal heart sounds.  Pulmonary:     Effort: Pulmonary effort is normal.     Breath sounds: Normal breath sounds.  Abdominal:     Tenderness: There is no abdominal tenderness.     Comments: Pregnant   Musculoskeletal:     Cervical back: Normal range of motion.  Skin:    General: Skin is warm.     Capillary Refill: Capillary refill takes less than 2 seconds.  Neurological:     Mental Status: She is alert.     ED Results / Procedures / Treatments   Labs (all labs ordered are listed, but only abnormal results are displayed) Labs  Reviewed  BASIC METABOLIC PANEL  CBC  TROPONIN I (HIGH SENSITIVITY)    EKG None  Radiology No results found.  Procedures Procedures  {Document cardiac monitor, telemetry assessment procedure when appropriate:1}  Medications Ordered in ED Medications - No data to display  ED Course/ Medical Decision Making/ A&P Clinical Course as of 02/06/23 1042  Sat Feb 06, 2023  1042 Fetal monitoring initiated [AL]    Clinical Course User Index [AL] Moyinoluwa Dawe, DO   {   Click here for ABCD2, HEART and other calculatorsREFRESH Note before signing :1}                              Medical Decision Making 22 year old female presenting with vomiting and intermittent left-sided pelvic discomfort.  Differential includes labor, gastritis, spontaneous abortion, UTI, and others. OB rapid response nurse has been called to bedside and is hooking the patient up to monitoring.  We will initiate IV fluids and labs.  We will get an EKG with troponin to evaluate for any cardiac abnormalities given her intermediate chest discomfort.  Concern for labor since she is [redacted] weeks pregnant and having intermediate pelvic discomfort.  Amount and/or Complexity of Data Reviewed Labs: ordered.    Final Clinical Impression(s) / ED Diagnoses Final diagnoses:  None    Rx / DC Orders ED Discharge Orders     None

## 2023-02-06 NOTE — MAU Note (Addendum)
Kelsey Jenkins is a 22 y.o. at [redacted]w[redacted]d here in MAU reporting: patient arrived via [facility] from [place] [facility] ED. Pt reporting had N/V/D last night and contractions started today and they are every 2-4 minutes. Denies any LOF or VB. +FM  Reports a headache yesterday but not today. LMP: n/a  Onset of complaint: last night/today  Pain score: 7 FHT:135

## 2023-02-06 NOTE — MAU Provider Note (Signed)
Chief Complaint:  Pelvic Pain and Chest Pain   HPI   Event Date/Time   First Provider Initiated Contact with Patient 02/06/23 1229      Kelsey Jenkins is a 22 y.o. G1P0 at [redacted]w[redacted]d who presents to maternity admissions reporting transfer from [facility] ED with c/o N/V/D last night and ctx q 2-3 minutes uncomfortable. On arrival patient still reporting ctx q 2-5 minutes irregular with tightening around her umbilicus. No c/o of further N/V/D. Denies any VB, LOF, and reports good FM's. She reports that she has a small baby and is following with MFM for weekly testing and a planned IOL at [redacted] weeks GA. She has a f/u scheduled 03/11/22 and 02/12/23.   Pregnancy Course: Complicated by Severe IUGR, Hypothyroidism, elevated HbA1C, and elevated umbilical dopplers.  Past Medical History:  Diagnosis Date   Asthma 2003   Diabetes mellitus without complication (HCC)    Hypoactive thyroid    Ovarian cyst    OB History  Gravida Para Term Preterm AB Living  1       SAB IAB Ectopic Multiple Live Births          # Outcome Date GA Lbr Len/2nd Weight Sex Type Anes PTL Lv  1 Current            Past Surgical History:  Procedure Laterality Date   CYSTECTOMY     LAPAROSCOPIC APPENDECTOMY N/A 01/10/2016   Procedure: APPENDECTOMY LAPAROSCOPIC;  Surgeon: Leonia Corona, MD;  Location: [facility] OR;  Service: General;  Laterality: N/A;   Family History  Problem Relation Age of Onset   Diabetes Mother    Obesity Mother    Thyroid disease Mother    Ovarian cysts Mother    Diabetes Maternal Grandmother    Hypertension Maternal Grandmother    Thyroid disease Maternal Grandmother    Hypertension Maternal Grandfather    Social History   Tobacco Use   Smoking status: Never    Passive exposure: Yes   Smokeless tobacco: Never  Substance Use Topics   Alcohol use: No   Drug use: Yes    Types: Marijuana    Comment: yesterday   Allergies  Allergen Reactions   Penicillins Hives   Medications Prior to Admission   Medication Sig Dispense Refill Last Dose/Taking   aspirin EC 81 MG tablet Take 1 tablet (81 mg total) by mouth daily. Start taking when you are [redacted] weeks pregnant for rest of pregnancy for prevention of preeclampsia 90 tablet 2 02/05/2023 Evening   Blood Pressure Monitoring (BLOOD PRESSURE KIT) DEVI 1 kit by Does not apply route once a week. Check Blood Pressure regularly and record readings into the Babyscripts App.  Large Cuff.  DX O90.0 1 each 0 02/06/2023   doxylamine, Sleep, (UNISOM) 25 MG tablet Take 1 tablet (25 mg total) by mouth at bedtime. 30 tablet 2 Past Month   famotidine (PEPCID) 20 MG tablet Take 1 tablet (20 mg total) by mouth daily. 30 tablet 3 Past Week   Misc. Devices (GOJJI WEIGHT SCALE) MISC 1 Device by Does not apply route once a week. 1 each 0 02/06/2023   ondansetron (ZOFRAN-ODT) 4 MG disintegrating tablet Take 1 tablet (4 mg total) by mouth every 8 (eight) hours as needed for nausea. 12 tablet 0 Past Month   pyridOXINE (VITAMIN B6) 25 MG tablet Take 1 tablet (25 mg total) by mouth in the morning, at noon, and at bedtime. 90 tablet 2 Past Month   acetaminophen (TYLENOL) 500 MG tablet Take  1,000 mg by mouth every 6 (six) hours as needed for headache (pain).   More than a month   albuterol (VENTOLIN HFA) 108 (90 Base) MCG/ACT inhaler Inhale 2 puffs into the lungs every 6 (six) hours as needed for wheezing or shortness of breath.   More than a month   loratadine (CLARITIN) 10 MG tablet Take 10 mg by mouth every evening.   More than a month    I have reviewed patient's Past Medical Hx, Surgical Hx, Family Hx, Social Hx, medications and allergies.   ROS  Pertinent items noted in HPI and remainder of comprehensive ROS otherwise negative.   PHYSICAL EXAM  Patient Vitals for the past 24 hrs:  BP Temp Temp src Pulse Resp SpO2 Height Weight  02/06/23 1446 131/64 -- -- (!) 43 -- -- -- --  02/06/23 1431 (!) 140/74 -- -- (!) 43 -- -- -- --  02/06/23 1416 130/65 -- -- (!) 44 -- --  -- --  02/06/23 1403 (!) 142/82 -- -- (!) 44 -- -- -- --  02/06/23 1346 135/72 -- -- (!) 46 -- -- -- --  02/06/23 1331 124/71 -- -- (!) 46 -- -- -- --  02/06/23 1316 123/80 -- -- (!) 48 -- -- -- --  02/06/23 1301 122/73 -- -- (!) 49 -- -- -- --  02/06/23 1245 (!) 145/79 -- -- (!) 46 -- -- -- --  02/06/23 1231 (!) 151/86 -- -- (!) 44 -- -- -- --  02/06/23 1224 138/83 -- -- (!) 46 -- -- -- --  02/06/23 1214 (!) 145/86 98 F (36.7 C) Oral (!) 46 16 100 % -- --  02/06/23 1110 138/85 98.4 F (36.9 C) Oral (!) 44 16 100 % -- --  02/06/23 1100 (!) 158/87 -- -- (!) 46 -- 100 % -- --  02/06/23 1030 123/65 -- -- (!) 49 -- 100 % -- --  02/06/23 0955 -- -- -- -- -- -- 5\' 3"  (1.6 m) 70.3 kg  02/06/23 0942 122/74 97.7 F (36.5 C) Oral (!) 57 16 99 % -- --    Constitutional: Well-developed, well-nourished female in no acute distress.  Cardiovascular: normal rate & rhythm, warm and well-perfused Respiratory: normal effort, no problems with respiration noted GI: Abd soft, non-tender, non-distended MS: Extremities nontender, no edema, normal ROM Neurologic: Alert and oriented x 4.  GU: no CVA tenderness Pelvic: NEFG, physiologic discharge, no blood, cervix clean.   Dilation: 2 Effacement (%): 80 Cervical Position: Posterior Station: -3 Presentation: Vertex Exam by:: Sheryle Spray., NP  Fetal Tracing: Baseline:130 Variability: moderate Accelerations: Yes Decelerations: No  Toco: irregular ctx ( Spaced now q10 minutes)   Labs: Results for orders placed or performed during the hospital encounter of 02/06/23 (from the past 24 hours)  Basic metabolic panel     Status: None   Collection Time: 02/06/23 10:35 AM  Result Value Ref Range   Sodium 135 135 - 145 mmol/L   Potassium 3.7 3.5 - 5.1 mmol/L   Chloride 105 98 - 111 mmol/L   CO2 23 22 - 32 mmol/L   Glucose, Bld 73 70 - 99 mg/dL   BUN 13 6 - 20 mg/dL   Creatinine, Ser 7.82 0.44 - 1.00 mg/dL   Calcium 8.9 8.9 - 95.6 mg/dL   GFR, Estimated  >21 >30 mL/min   Anion gap 7 5 - 15  CBC     Status: None   Collection Time: 02/06/23 10:35 AM  Result Value Ref Range  WBC 9.5 4.0 - 10.5 K/uL   RBC 3.95 3.87 - 5.11 MIL/uL   Hemoglobin 12.9 12.0 - 15.0 g/dL   HCT 06.2 69.4 - 85.4 %   MCV 97.5 80.0 - 100.0 fL   MCH 32.7 26.0 - 34.0 pg   MCHC 33.5 30.0 - 36.0 g/dL   RDW 62.7 03.5 - 00.9 %   Platelets 249 150 - 400 K/uL   nRBC 0.0 0.0 - 0.2 %  Troponin I (High Sensitivity)     Status: None   Collection Time: 02/06/23 10:35 AM  Result Value Ref Range   Troponin I (High Sensitivity) 4 <18 ng/L  Urinalysis, Routine w reflex microscopic -Urine, Clean Catch     Status: Abnormal   Collection Time: 02/06/23 11:53 AM  Result Value Ref Range   Color, Urine STRAW (A) YELLOW   APPearance HAZY (A) CLEAR   Specific Gravity, Urine 1.004 (L) 1.005 - 1.030   pH 7.0 5.0 - 8.0   Glucose, UA NEGATIVE NEGATIVE mg/dL   Hgb urine dipstick NEGATIVE NEGATIVE   Bilirubin Urine NEGATIVE NEGATIVE   Ketones, ur NEGATIVE NEGATIVE mg/dL   Protein, ur NEGATIVE NEGATIVE mg/dL   Nitrite NEGATIVE NEGATIVE   Leukocytes,Ua NEGATIVE NEGATIVE  Wet prep, genital     Status: None   Collection Time: 02/06/23 12:51 PM   Specimen: Vaginal  Result Value Ref Range   Yeast Wet Prep HPF POC NONE SEEN NONE SEEN   Trich, Wet Prep NONE SEEN NONE SEEN   Clue Cells Wet Prep HPF POC NONE SEEN NONE SEEN   WBC, Wet Prep HPF POC <10 <10   Sperm NONE SEEN   Group B strep by PCR     Status: None   Collection Time: 02/06/23 12:54 PM   Specimen: Vaginal/Rectal; Genital  Result Value Ref Range   Group B strep by PCR PRESUMPTIVE NEGATIVE PRESUMPTIVE NEGATIVE  Comprehensive metabolic panel     Status: Abnormal   Collection Time: 02/06/23  1:21 PM  Result Value Ref Range   Sodium 136 135 - 145 mmol/L   Potassium 3.9 3.5 - 5.1 mmol/L   Chloride 107 98 - 111 mmol/L   CO2 21 (L) 22 - 32 mmol/L   Glucose, Bld 73 70 - 99 mg/dL   BUN 8 6 - 20 mg/dL   Creatinine, Ser 3.81 0.44 -  1.00 mg/dL   Calcium 8.7 (L) 8.9 - 10.3 mg/dL   Total Protein 6.1 (L) 6.5 - 8.1 g/dL   Albumin 2.6 (L) 3.5 - 5.0 g/dL   AST 19 15 - 41 U/L   ALT 12 0 - 44 U/L   Alkaline Phosphatase 172 (H) 38 - 126 U/L   Total Bilirubin 0.4 <1.2 mg/dL   GFR, Estimated >82 >99 mL/min   Anion gap 8 5 - 15  Protein / creatinine ratio, urine     Status: None   Collection Time: 02/06/23  1:45 PM  Result Value Ref Range   Creatinine, Urine 52 mg/dL   Total Protein, Urine 8 mg/dL   Protein Creatinine Ratio 0.15 0.00 - 0.15 mg/mg[Cre]    Imaging:    MDM & MAU COURSE  MDM: High  MAU Course: Orders Placed This Encounter  Procedures   Wet prep, genital   Group B strep by PCR   Basic metabolic panel   CBC   Urinalysis, Routine w reflex microscopic -Urine, Clean Catch   Comprehensive metabolic panel   Protein / creatinine ratio, urine   Document Height  and Actual Weight   ED EKG   Discharge patient   Meds ordered this encounter  Medications   lactated ringers bolus 1,000 mL   lactated ringers bolus 1,000 mL    I independently have reviewed the patients chart including PNR,  vital signs, and imaging results from 01/14/23 (c/w severe IUGR and elevated Umbilical dopplers) Labs as ordered above and have consulted with the [facility] Attending   Discussion with DR. Debroah Loop [facility] Attending) regarding patient status and concern for PTL at 35.3 GA with IUGR and NICU at [facility]. Per Dr Debroah Loop will continue to observe at this time and reassess after IVF Bolus and recheck. NST with CTX q 2-5 minutes and cervical exam 2/80/-3 Intact.  -Patient reassessed after IVF Bolus @1359  - NST reactive and CTX have spaced out ~ q 10 minutes - Cervical exam unchanged  - Patient given PO fluids d/t IVF shortage  - No evidence of active PTL at this time  Due to her GA and severe IUGR fetus with mild range BP's  would consider admission for continued monitoring and surveillance vs discharge with strict return  precautions  -will further discuss with Dr Debroah Loop  @1439  Discussion with Dr Debroah Loop:  MD Reviewed patient presentation, results, FHRT, imaging and patient records. MD feels patient is stable for Discharge home with strict return precautions if ctx persist and or labor s/s  persist.     ASSESSMENT   1. [redacted] weeks gestation of pregnancy   2. Preterm uterine contractions, antepartum, third trimester   3. Elevated BP without diagnosis of hypertension   4. IUGR (intrauterine growth restriction) affecting care of mother, third trimester, fetus 1     PLAN  Discharge home in stable condition with return precautions.  F/U as scheduled 03/11/22    Allergies as of 02/06/2023       Reactions   Penicillins Hives        Medication List     TAKE these medications    acetaminophen 500 MG tablet Commonly known as: TYLENOL Take 1,000 mg by mouth every 6 (six) hours as needed for headache (pain).   albuterol 108 (90 Base) MCG/ACT inhaler Commonly known as: VENTOLIN HFA Inhale 2 puffs into the lungs every 6 (six) hours as needed for wheezing or shortness of breath.   aspirin EC 81 MG tablet Take 1 tablet (81 mg total) by mouth daily. Start taking when you are [redacted] weeks pregnant for rest of pregnancy for prevention of preeclampsia   Blood Pressure Kit Devi 1 kit by Does not apply route once a week. Check Blood Pressure regularly and record readings into the Babyscripts App.  Large Cuff.  DX O90.0   doxylamine (Sleep) 25 MG tablet Commonly known as: UNISOM Take 1 tablet (25 mg total) by mouth at bedtime.   famotidine 20 MG tablet Commonly known as: Pepcid Take 1 tablet (20 mg total) by mouth daily.   Gojji Weight Scale Misc 1 Device by Does not apply route once a week.   loratadine 10 MG tablet Commonly known as: CLARITIN Take 10 mg by mouth every evening.   ondansetron 4 MG disintegrating tablet Commonly known as: ZOFRAN-ODT Take 1 tablet (4 mg total) by mouth every 8 (eight) hours as needed  for nausea.   pyridOXINE 25 MG tablet Commonly known as: VITAMIN B6 Take 1 tablet (25 mg total) by mouth in the morning, at noon, and at bedtime.       Marcell Barlow, MSN, WHNP-BC [facility] [employer]

## 2023-02-06 NOTE — Progress Notes (Signed)
Pt transferred to [facility] MAU by [facility].

## 2023-02-08 ENCOUNTER — Inpatient Hospital Stay (HOSPITAL_COMMUNITY)
Admission: AD | Admit: 2023-02-08 | Discharge: 2023-02-08 | Disposition: A | Discharge status: Home / Self Care | Payer: Self-pay | Attending: Obstetrics and Gynecology | Admitting: Obstetrics and Gynecology

## 2023-02-08 DIAGNOSIS — Z3A35 35 weeks gestation of pregnancy: Secondary | ICD-10-CM | POA: Diagnosis not present

## 2023-02-08 DIAGNOSIS — O212 Late vomiting of pregnancy: Secondary | ICD-10-CM | POA: Diagnosis not present

## 2023-02-08 DIAGNOSIS — Z3400 Encounter for supervision of normal first pregnancy, unspecified trimester: Secondary | ICD-10-CM

## 2023-02-08 DIAGNOSIS — O368130 Decreased fetal movements, third trimester, not applicable or unspecified: Secondary | ICD-10-CM | POA: Insufficient documentation

## 2023-02-08 LAB — GC/CHLAMYDIA PROBE AMP (~~LOC~~) NOT AT ARMC
Chlamydia: NEGATIVE
Comment: NEGATIVE
Comment: NORMAL
Neisseria Gonorrhea: NEGATIVE

## 2023-02-08 LAB — URINALYSIS, ROUTINE W REFLEX MICROSCOPIC
Bilirubin Urine: NEGATIVE
Glucose, UA: NEGATIVE mg/dL
Hgb urine dipstick: NEGATIVE
Ketones, ur: NEGATIVE mg/dL
Leukocytes,Ua: NEGATIVE
Nitrite: NEGATIVE
Protein, ur: NEGATIVE mg/dL
Specific Gravity, Urine: 1.015 (ref 1.005–1.030)
pH: 8 (ref 5.0–8.0)

## 2023-02-08 MED ORDER — PROMETHAZINE HCL 25 MG/ML IJ SOLN
12.5000 mg | Freq: Once | INTRAMUSCULAR | Status: AC
Start: 2023-02-08 — End: 2023-02-08
  Administered 2023-02-08: 12.5 mg via INTRAMUSCULAR
  Filled 2023-02-08: qty 1

## 2023-02-08 MED ORDER — PROMETHAZINE HCL 12.5 MG PO TABS: 12.5000 mg | ORAL_TABLET | Freq: Four times a day (QID) | ORAL | 3 refills | Status: DC | PRN

## 2023-02-08 NOTE — MAU Note (Signed)
.  Kelsey Jenkins is a 22 y.o. at [redacted]w[redacted]d here in MAU reporting: still having cramping on and off and pelvic pressure. Was nauseated and vomited this morning. Continues to feel nauseated and has not been able to eat all day. Stated she has not felt baby move much today. Denies any vag bleeding and reports a normal white discharge. No leaking.   LMP:  Onset of complaint: today Pain score: 5 Vitals:   02/08/23 1713  BP: 117/73  Pulse: (!) 50  Resp: 18  Temp: 98.1 F (36.7 C)     FHT:130 Lab orders placed from triage: u/a

## 2023-02-09 ENCOUNTER — Ambulatory Visit: Payer: Medicaid Other

## 2023-02-10 NOTE — L&D Delivery Note (Signed)
   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  - Thick Mec   First Stage:  Induction of labor:02/24/23 Onset of labor: 02/24/2023 @ 1945 Augmentation: AROM and Pitocin ROM: AROM @ 1945 Mec stained fluid  Active labor onset: 02/24/2023 @ 1945 Analgesia /Anesthesia/Pain control intrapartum: Epidural  Second Stage:  Complete dilation at 02/25/2023 1431 Onset of pushing at 1505 FHR second stage Cat II. FHT Mid 100's variable decles with contractions that returned to baseline.    CNM & SNM called to patient bedside as patient was deceling and RN was concerned for cervical swelling on maternal right. Upon assessment, exam initially 7.5/90/+2 station. With next contraction, cervix easily dilated to complete and patient feeling rectal pressure. Patient had strong desire to wait for her mom's return to start pushing. Discussed if fetal intolerance occurred, the recommendation would be to push at that time, despite mother's presence. Patient verbalized understanding.  Position changed to right maternal side and FHT tolerated well until Mother's arrival.   Upon arrival Patient Pushing in lithotomy position with CNM, SNM, NICU team and L&D staff support.  MOP present for birth and supportive.   Delivery of a Live born female  Birth Weight: 3 lb 3.9 oz (1470 g) APGAR: 5, 9  Newborn Delivery   Birth date/time: 02/25/2023 15:09:00 Delivery type: Vaginal, Spontaneous    With great maternal pushing efforts, fetal head delivered  in cephalic presentation, position DOA and spontaneously restituted  to ROT and remaining fetal body forcefully  expelled and infant placed immediately S2S with mom. With drying and stimulation, infant gave a weak cry. Tone appropriate and NICU agreed to wait the full minute prior to cutting.  Nuchal Cord: No    After 1 min of life cord double clamped and cut by MOP. Infant taken over to warmer and received by NICU team. Please see NICU note for details.  Collection of cord blood for typing completed. Cord blood donation-None Arterial cord blood sample-No   Third Stage:  With gentle cord traction and Maternal pushing efforts Placenta delivered intact-Spontaneous;Pathology with 3 vessels. Uterine tone firm bleeding scant Uterotonics: IV pit bolus  Placenta to Path.  2nd degree;Perineal laceration identified.  Episiotomy:  Local analgesia: N/A   Repair: Hemostatic periurethral and labial abrasions. 2nd degree tear to perineal floor repaired with 3.0 vicryl as 1 interrupted stitch.  Est. Blood Loss (mL):105.00  Complications: None   Mom to postpartum.  Baby girl "[name]" to NICU.  Delivery Report:  Review the Delivery Report for details.    Kelsey Jenkins Danella Deis) Suzie Portela, MSN, CNM  Center for [facility]  02/25/23  3:57 PM

## 2023-02-15 NOTE — BH Specialist Note (Signed)
Error--rescheduled

## 2023-02-24 ENCOUNTER — Ambulatory Visit: Payer: Medicaid Other | Attending: Obstetrics and Gynecology

## 2023-02-24 ENCOUNTER — Ambulatory Visit: Payer: Medicaid Other

## 2023-02-24 ENCOUNTER — Other Ambulatory Visit: Payer: Self-pay | Admitting: Maternal & Fetal Medicine

## 2023-02-24 ENCOUNTER — Encounter (HOSPITAL_COMMUNITY): Payer: Self-pay | Admitting: Family Medicine

## 2023-02-24 ENCOUNTER — Inpatient Hospital Stay (HOSPITAL_COMMUNITY)
Admission: AD | Admit: 2023-02-24 | Discharge: 2023-02-27 | DRG: 807 | Disposition: A | Discharge status: Home / Self Care | Payer: Medicaid Other | Attending: Family Medicine | Admitting: Family Medicine

## 2023-02-24 ENCOUNTER — Other Ambulatory Visit: Payer: Self-pay

## 2023-02-24 VITALS — BP 140/83 | HR 51

## 2023-02-24 DIAGNOSIS — E039 Hypothyroidism, unspecified: Secondary | ICD-10-CM | POA: Diagnosis present

## 2023-02-24 DIAGNOSIS — Z8249 Family history of ischemic heart disease and other diseases of the circulatory system: Secondary | ICD-10-CM

## 2023-02-24 DIAGNOSIS — O99283 Endocrine, nutritional and metabolic diseases complicating pregnancy, third trimester: Secondary | ICD-10-CM | POA: Diagnosis not present

## 2023-02-24 DIAGNOSIS — O99513 Diseases of the respiratory system complicating pregnancy, third trimester: Secondary | ICD-10-CM | POA: Diagnosis not present

## 2023-02-24 DIAGNOSIS — Z880 Allergy status to penicillin: Secondary | ICD-10-CM | POA: Diagnosis not present

## 2023-02-24 DIAGNOSIS — O365930 Maternal care for other known or suspected poor fetal growth, third trimester, not applicable or unspecified: Principal | ICD-10-CM | POA: Diagnosis present

## 2023-02-24 DIAGNOSIS — Z3A38 38 weeks gestation of pregnancy: Secondary | ICD-10-CM

## 2023-02-24 DIAGNOSIS — J45909 Unspecified asthma, uncomplicated: Secondary | ICD-10-CM

## 2023-02-24 DIAGNOSIS — O4103X0 Oligohydramnios, third trimester, not applicable or unspecified: Secondary | ICD-10-CM | POA: Insufficient documentation

## 2023-02-24 DIAGNOSIS — O365990 Maternal care for other known or suspected poor fetal growth, unspecified trimester, not applicable or unspecified: Secondary | ICD-10-CM

## 2023-02-24 DIAGNOSIS — O1414 Severe pre-eclampsia complicating childbirth: Secondary | ICD-10-CM | POA: Diagnosis not present

## 2023-02-24 DIAGNOSIS — Z833 Family history of diabetes mellitus: Secondary | ICD-10-CM

## 2023-02-24 DIAGNOSIS — O26893 Other specified pregnancy related conditions, third trimester: Secondary | ICD-10-CM | POA: Diagnosis not present

## 2023-02-24 DIAGNOSIS — O99810 Abnormal glucose complicating pregnancy: Secondary | ICD-10-CM | POA: Diagnosis not present

## 2023-02-24 DIAGNOSIS — Z3490 Encounter for supervision of normal pregnancy, unspecified, unspecified trimester: Principal | ICD-10-CM | POA: Diagnosis present

## 2023-02-24 DIAGNOSIS — O99284 Endocrine, nutritional and metabolic diseases complicating childbirth: Secondary | ICD-10-CM | POA: Diagnosis present

## 2023-02-24 DIAGNOSIS — O99324 Drug use complicating childbirth: Secondary | ICD-10-CM | POA: Diagnosis not present

## 2023-02-24 LAB — COMPREHENSIVE METABOLIC PANEL
ALT: 26 U/L (ref 0–44)
AST: 40 U/L (ref 15–41)
Albumin: 2.7 g/dL — ABNORMAL LOW (ref 3.5–5.0)
Alkaline Phosphatase: 209 U/L — ABNORMAL HIGH (ref 38–126)
Anion gap: 8 (ref 5–15)
BUN: 11 mg/dL (ref 6–20)
CO2: 20 mmol/L — ABNORMAL LOW (ref 22–32)
Calcium: 8.5 mg/dL — ABNORMAL LOW (ref 8.9–10.3)
Chloride: 109 mmol/L (ref 98–111)
Creatinine, Ser: 0.75 mg/dL (ref 0.44–1.00)
GFR, Estimated: >60 mL/min (ref >60)
Glucose, Bld: 74 mg/dL (ref 70–99)
Potassium: 3.9 mmol/L (ref 3.5–5.1)
Sodium: 137 mmol/L (ref 135–145)
Total Bilirubin: 0.6 mg/dL (ref 0.0–1.2)
Total Protein: 6.6 g/dL (ref 6.5–8.1)

## 2023-02-24 LAB — CBC
HCT: 36.6 % (ref 36.0–46.0)
Hemoglobin: 12.5 g/dL (ref 12.0–15.0)
MCH: 32.1 pg (ref 26.0–34.0)
MCHC: 34.2 g/dL (ref 30.0–36.0)
MCV: 94.1 fL (ref 80.0–100.0)
Platelets: 266 10*3/uL (ref 150–400)
RBC: 3.89 MIL/uL (ref 3.87–5.11)
RDW: 13 % (ref 11.5–15.5)
WBC: 10 10*3/uL (ref 4.0–10.5)
nRBC: 0 % (ref 0.0–0.2)

## 2023-02-24 LAB — CBC WITH DIFFERENTIAL/PLATELET
Abs Immature Granulocytes: 0.04 10*3/uL (ref 0.00–0.07)
Basophils Absolute: 0 10*3/uL (ref 0.0–0.1)
Basophils Relative: 0 %
Eosinophils Absolute: 0.1 10*3/uL (ref 0.0–0.5)
Eosinophils Relative: 0 %
HCT: 37.8 % (ref 36.0–46.0)
Hemoglobin: 13.1 g/dL (ref 12.0–15.0)
Immature Granulocytes: 0 %
Lymphocytes Relative: 17 %
Lymphs Abs: 2.5 10*3/uL (ref 0.7–4.0)
MCH: 32.3 pg (ref 26.0–34.0)
MCHC: 34.7 g/dL (ref 30.0–36.0)
MCV: 93.3 fL (ref 80.0–100.0)
Monocytes Absolute: 1 10*3/uL (ref 0.1–1.0)
Monocytes Relative: 7 %
Neutro Abs: 10.5 10*3/uL — ABNORMAL HIGH (ref 1.7–7.7)
Neutrophils Relative %: 76 %
Platelets: 233 10*3/uL (ref 150–400)
RBC: 4.05 MIL/uL (ref 3.87–5.11)
RDW: 12.7 % (ref 11.5–15.5)
WBC: 14.1 10*3/uL — ABNORMAL HIGH (ref 4.0–10.5)
nRBC: 0 % (ref 0.0–0.2)

## 2023-02-24 LAB — TYPE AND SCREEN
ABO/RH(D): O POS
Antibody Screen: NEGATIVE

## 2023-02-24 LAB — RPR: RPR Ser Ql: NONREACTIVE

## 2023-02-24 LAB — PROTEIN / CREATININE RATIO, URINE
Creatinine, Urine: 111 mg/dL
Protein Creatinine Ratio: 0.17 mg/mg{creat} — ABNORMAL HIGH (ref 0.00–0.15)
Total Protein, Urine: 19 mg/dL

## 2023-02-24 LAB — GROUP B STREP BY PCR: Group B strep by PCR: NEGATIVE

## 2023-02-24 LAB — T4, FREE: Free T4: 0.74 ng/dL (ref 0.61–1.12)

## 2023-02-24 LAB — TSH: TSH: 5.055 u[IU]/mL — ABNORMAL HIGH (ref 0.350–4.500)

## 2023-02-24 MED ORDER — MAGNESIUM SULFATE 40 GM/1000ML IV SOLN
INTRAVENOUS | Status: AC
  Administered 2023-02-24: 4 g via INTRAVENOUS
  Filled 2023-02-24: qty 1000

## 2023-02-24 MED ORDER — LABETALOL HCL 5 MG/ML IV SOLN
20.0000 mg | INTRAVENOUS | Status: DC | PRN
Start: 2023-02-24 — End: 2023-02-24
  Administered 2023-02-24: 20 mg via INTRAVENOUS

## 2023-02-24 MED ORDER — MAGNESIUM SULFATE 40 GM/1000ML IV SOLN
1.0000 g/h | INTRAVENOUS | Status: DC
  Administered 2023-02-24 – 2023-02-26 (×3): 2 g/h via INTRAVENOUS
  Filled 2023-02-24 (×2): qty 1000

## 2023-02-24 MED ORDER — HYDRALAZINE HCL 20 MG/ML IJ SOLN
10.0000 mg | INTRAMUSCULAR | Status: DC | PRN
Start: 2023-02-24 — End: 2023-02-24

## 2023-02-24 MED ORDER — LABETALOL HCL 5 MG/ML IV SOLN
40.0000 mg | INTRAVENOUS | Status: DC | PRN
  Filled 2023-02-24: qty 8

## 2023-02-24 MED ORDER — ONDANSETRON HCL 4 MG/2ML IJ SOLN
4.0000 mg | Freq: Four times a day (QID) | INTRAMUSCULAR | Status: DC | PRN
  Administered 2023-02-24 – 2023-02-25 (×3): 4 mg via INTRAVENOUS
  Filled 2023-02-24 (×3): qty 2

## 2023-02-24 MED ORDER — LACTATED RINGERS IV SOLN
500.0000 mL | INTRAVENOUS | Status: AC | PRN
  Administered 2023-02-24: 1000 mL via INTRAVENOUS

## 2023-02-24 MED ORDER — OXYTOCIN-SODIUM CHLORIDE 30-0.9 UT/500ML-% IV SOLN
2.5000 [IU]/h | INTRAVENOUS | Status: DC
  Administered 2023-02-25: 2.5 [IU]/h via INTRAVENOUS
  Filled 2023-02-24 (×2): qty 500

## 2023-02-24 MED ORDER — PHENYLEPHRINE 80 MCG/ML (10ML) SYRINGE FOR IV PUSH (FOR BLOOD PRESSURE SUPPORT): 80.0000 ug | PREFILLED_SYRINGE | INTRAVENOUS | Status: DC | PRN

## 2023-02-24 MED ORDER — ACETAMINOPHEN 325 MG PO TABS
650.0000 mg | ORAL_TABLET | ORAL | Status: DC | PRN
  Filled 2023-02-24: qty 2

## 2023-02-24 MED ORDER — OXYTOCIN BOLUS FROM INFUSION
333.0000 mL | Freq: Once | INTRAVENOUS | Status: AC
  Administered 2023-02-25: 333 mL via INTRAVENOUS

## 2023-02-24 MED ORDER — OXYCODONE-ACETAMINOPHEN 5-325 MG PO TABS: 2.0000 | ORAL_TABLET | ORAL | Status: DC | PRN

## 2023-02-24 MED ORDER — OXYCODONE-ACETAMINOPHEN 5-325 MG PO TABS: 1.0000 | ORAL_TABLET | ORAL | Status: DC | PRN

## 2023-02-24 MED ORDER — SOD CITRATE-CITRIC ACID 500-334 MG/5ML PO SOLN: 30.0000 mL | ORAL | Status: DC | PRN

## 2023-02-24 MED ORDER — EPHEDRINE 5 MG/ML INJ: 10.0000 mg | INTRAVENOUS | Status: DC | PRN

## 2023-02-24 MED ORDER — LACTATED RINGERS AMNIOINFUSION
INTRAVENOUS | Status: DC
  Filled 2023-02-24 (×5): qty 1000

## 2023-02-24 MED ORDER — OXYTOCIN-SODIUM CHLORIDE 30-0.9 UT/500ML-% IV SOLN
1.0000 m[IU]/min | INTRAVENOUS | Status: DC
Start: 2023-02-24 — End: 2023-02-26
  Administered 2023-02-24: 6 m[IU]/min via INTRAVENOUS
  Administered 2023-02-24: 2 m[IU]/min via INTRAVENOUS
  Administered 2023-02-25: 6 m[IU]/min via INTRAVENOUS

## 2023-02-24 MED ORDER — LABETALOL HCL 5 MG/ML IV SOLN
40.0000 mg | INTRAVENOUS | Status: DC | PRN
Start: 2023-02-24 — End: 2023-02-24
  Administered 2023-02-24: 40 mg via INTRAVENOUS

## 2023-02-24 MED ORDER — LACTATED RINGERS IV SOLN
500.0000 mL | Freq: Once | INTRAVENOUS | Status: AC
  Administered 2023-02-25: 500 mL via INTRAVENOUS

## 2023-02-24 MED ORDER — SODIUM CHLORIDE 0.9 % IV SOLN
25.0000 mg | Freq: Once | INTRAVENOUS | Status: AC
  Administered 2023-02-25: 25 mg via INTRAVENOUS
  Filled 2023-02-24: qty 1

## 2023-02-24 MED ORDER — MAGNESIUM SULFATE BOLUS VIA INFUSION
4.0000 g | Freq: Once | INTRAVENOUS | Status: AC
  Filled 2023-02-24: qty 1000

## 2023-02-24 MED ORDER — LIDOCAINE HCL (PF) 1 % IJ SOLN: 30.0000 mL | INTRAMUSCULAR | Status: DC | PRN

## 2023-02-24 MED ORDER — HYDRALAZINE HCL 20 MG/ML IJ SOLN: 10.0000 mg | INTRAMUSCULAR | Status: DC | PRN

## 2023-02-24 MED ORDER — LABETALOL HCL 5 MG/ML IV SOLN: 80.0000 mg | INTRAVENOUS | Status: DC | PRN

## 2023-02-24 MED ORDER — LACTATED RINGERS IV SOLN: INTRAVENOUS | Status: AC

## 2023-02-24 MED ORDER — FENTANYL CITRATE (PF) 100 MCG/2ML IJ SOLN
100.0000 ug | INTRAMUSCULAR | Status: DC | PRN
  Administered 2023-02-24: 100 ug via INTRAVENOUS
  Filled 2023-02-24: qty 2

## 2023-02-24 MED ORDER — TERBUTALINE SULFATE 1 MG/ML IJ SOLN
0.2500 mg | Freq: Once | INTRAMUSCULAR | Status: AC | PRN
  Administered 2023-02-24: 0.25 mg via SUBCUTANEOUS
  Filled 2023-02-24: qty 1

## 2023-02-24 MED ORDER — FENTANYL-BUPIVACAINE-NACL 0.5-0.125-0.9 MG/250ML-% EP SOLN
12.0000 mL/h | EPIDURAL | Status: DC | PRN
  Administered 2023-02-25: 12 mL/h via EPIDURAL
  Filled 2023-02-24: qty 250

## 2023-02-24 MED ORDER — LABETALOL HCL 5 MG/ML IV SOLN
20.0000 mg | INTRAVENOUS | Status: DC | PRN
  Filled 2023-02-24: qty 4

## 2023-02-24 MED ORDER — DIPHENHYDRAMINE HCL 50 MG/ML IJ SOLN: 12.5000 mg | INTRAMUSCULAR | Status: DC | PRN

## 2023-02-24 MED ORDER — LABETALOL HCL 5 MG/ML IV SOLN
80.0000 mg | INTRAVENOUS | Status: DC | PRN
Start: 2023-02-24 — End: 2023-02-24

## 2023-02-24 MED ORDER — LACTATED RINGERS IV SOLN: INTRAVENOUS | Status: DC

## 2023-02-24 NOTE — Progress Notes (Signed)
 Labor Progress Note Kelsey Jenkins is a 23 y.o. G1P0 at [redacted]w[redacted]d presented for IOL due to severe FGR and now with pre-eclampsia with SF.  S: Feeling more pressure.  O:  BP (!) 135/96   Pulse (!) 54   Temp 97.7 F (36.5 C) (Oral)   Resp 17   Ht 5\' 3"  (1.6 m)   Wt 72.5 kg   LMP 06/03/2022   SpO2 99%   BMI 28.33 kg/m  EFM: 115/mod/-a/-d  CVE: Dilation: 5.5 Effacement (%): 100 Cervical Position: Posterior Station: 0 Presentation: Vertex Exam by:: Gala Jubilee, MD   A&P: 23 y.o. G1P0 [redacted]w[redacted]d here for IOL in setting of severe IUGR and pre-e w SF. #Labor: Progressing well. Cont current management.  #Pain: Desires epidural #FWB: Cat II, overall reassuring strip #GBS negative   #FGR: EFW 1692g, <1%, AC <1% today  UAD elevated, no signs of AEDF or REDF  #Pre-e w SF: Met criteria for SF w BP, requiring Labetalol  x 2 doses  on mag  PEC labs wnl  #Hypothyroidism: TSH elevated, though normal FT4  not on meds previously  no sxs  would recheck in 4-6 wks PP  Melanie Spires, MD 10:15 PM

## 2023-02-24 NOTE — Progress Notes (Signed)
 Labor Progress Note Kelsey Jenkins is a 23 y.o. G1P0 at [redacted]w[redacted]d presented for IOL due to severe FGR and now with pre-eclampsia with SF.  S: No questions at this time.   O:  BP (!) 135/96   Pulse (!) 54   Temp 97.7 F (36.5 C) (Oral)   Resp 17   Ht 5\' 3"  (1.6 m)   Wt 72.5 kg   LMP 06/03/2022   SpO2 99%   BMI 28.33 kg/m  EFM: 115/mod/+10x10 accels/occ variables  CVE: Dilation: 4 Effacement (%): 90 Cervical Position: Posterior Station: 0 Presentation: Vertex Exam by:: Gala Jubilee, MD   A&P: 23 y.o. G1P0 [redacted]w[redacted]d here for IOL in setting of severe IUGR and pre-e w SF. #Labor: Progressing well. Discussed placement of IUPC for pt given occ variables and heavy mec on AROM. Will start AI as well to help w variables. Pt tolerated placement of IUPC well.  #Pain: Desires epidural #FWB: Cat II, overall reassuring strip #GBS negative   #FGR: EFW 1692g, <1%, AC <1% today  UAD elevated, no signs of AEDF or REDF  #Pre-e w SF: Met criteria for SF w BP, requiring Labetalol  x 2 doses  on mag  PEC labs wnl  #Hypothyroidism: TSH elevated, though normal FT4  not on meds previously  no sxs  would recheck in 4-6 wks PP  Melanie Spires, MD 9:39 PM

## 2023-02-24 NOTE — Progress Notes (Signed)
 Kelsey Jenkins is a 23 y.o. G1P0 at [redacted]w[redacted]d by LMP admitted for induction of labor due to Pre-eclamptic toxemia of pregnancy., Poor fetal growth, and elevated dopplers.  Subjective: Patient doing well. Denies feeling many contractions.   Objective: BP 114/78 (BP Location: Right Arm)   Pulse (!) 54   Temp 97.7 F (36.5 C) (Oral)   Resp 17   Ht 5\' 3"  (1.6 m)   Wt 72.5 kg   LMP 06/03/2022   BMI 28.33 kg/m  I/O last 3 completed shifts: In: 1438.5 [P.O.:220; I.V.:1218.5] Out: 1000 [Urine:800; Emesis/NG output:200] No intake/output data recorded.  FHT:  FHR: 115 bpm, variability: moderate,  accelerations:  Abscent,  decelerations:  Absent UC:   irregular, every 5-7 minutes SVE:   Dilation: 3.5 Effacement (%): 90 Station: 0 Exam by:: Brenita Callow, CNM  Labs: Lab Results  Component Value Date   WBC 10.0 02/24/2023   HGB 12.5 02/24/2023   HCT 36.6 02/24/2023   MCV 94.1 02/24/2023   PLT 266 02/24/2023   Patient Vitals for the past 24 hrs:  BP Temp Temp src Pulse Resp Height Weight  02/24/23 1918 114/78 97.7 F (36.5 C) Oral (!) 54 17 -- --  02/24/23 1900 109/76 -- -- 60 15 -- --  02/24/23 1830 129/69 -- -- (!) 54 -- -- --  02/24/23 1805 123/81 -- -- (!) 58 -- -- --  02/24/23 1800 -- -- -- -- 16 -- --  02/24/23 1730 107/70 -- -- (!) 58 -- -- --  02/24/23 1700 104/65 -- -- (!) 53 16 -- --  02/24/23 1630 119/72 -- -- (!) 49 -- -- --  02/24/23 1600 114/60 -- -- (!) 51 15 -- --  02/24/23 1530 108/61 -- -- (!) 51 -- -- --  02/24/23 1500 99/70 -- -- (!) 55 17 -- --  02/24/23 1430 110/77 -- -- 60 -- -- --  02/24/23 1400 134/65 -- -- 76 16 -- --  02/24/23 1330 137/74 -- -- (!) 47 -- -- --  02/24/23 1300 108/69 -- -- (!) 52 16 -- --  02/24/23 1230 114/80 -- -- 80 -- -- --  02/24/23 1228 -- -- -- -- 17 -- --  02/24/23 1218 -- -- -- -- 16 -- --  02/24/23 1215 116/73 -- -- 64 -- -- --  02/24/23 1208 (!) 165/89 -- -- (!) 43 17 -- --  02/24/23 1149 (!) 165/84 -- -- (!) 45 -- -- --   02/24/23 1132 (!) 177/89 -- -- (!) 45 -- -- --  02/24/23 1016 (!) 157/83 98.2 F (36.8 C) Oral (!) 47 16 -- --  02/24/23 0956 -- -- -- -- -- 5\' 3"  (1.6 m) 72.5 kg   CNM to patient bedside to discuss AROM. Reviewed risks and benefits with patient and Family. Patient desires to proceed with AROM. FHT Cat II prior to AROM. SVE 3.5/90/0 S. Marlys Singh CNM. Fetal head well applied to cervix, AROM successful on first attempt with the use of amnihook. Moderate mec stained fluid noted. Patient and fetus tolerated well and FHT remained Cat II  following AROM.    Assessment / Plan: Induction of labor due to preeclampsia and IUGR,  progressing well on pitocin   Labor: Progressing normally continue to titrate pit up as needed.  Preeclampsia:  on magnesium  sulfate, intake and ouput balanced, and labs stable Fetal Wellbeing:  Category II Pain Control:  Labor support without medications I/D:   GBS neg  Anticipated MOD:  NSVD  Corie Diamond, CNM  02/24/2023, 7:58 PM

## 2023-02-24 NOTE — H&P (Signed)
 LABOR AND DELIVERY ADMISSION HISTORY AND PHYSICAL NOTE  Kelsey Jenkins is a 23 y.o. female G1P0 with IUP at [redacted]w[redacted]d presenting for IOL.   Patient reports the fetal movement as active. Patient reports uterine contraction activity as none. Patient reports vaginal bleeding as none. Patient describes fluid per vagina as None.   Patient endorses bilateral lower extremity edema around ankles. Denies headache, vision changes, chest pain, shortness of breath, RUQ abdominal pain.  She plans on breast feeding and bottle feeding feeding. Her contraception plan is:  unsure, thinks she doesn't want oral contraceptives but open to further contraceptive counseling .  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   Past Medical History: Past Medical History:  Diagnosis Date   Asthma 2003   Diabetes mellitus without complication (HCC)    Hypoactive thyroid     Ovarian cyst    Past Surgical History: Past Surgical History:  Procedure Laterality Date   CYSTECTOMY     LAPAROSCOPIC APPENDECTOMY N/A 01/10/2016   Procedure: APPENDECTOMY LAPAROSCOPIC;  Surgeon: Alanda Allegra, MD;  Location: [facility] OR;  Service: General;  Laterality: N/A;   Obstetrical History: OB History     Gravida  1   Para      Term      Preterm      AB      Living         SAB      IAB      Ectopic      Multiple      Live Births             Social History: Social History   Socioeconomic History   Marital status: Single     Spouse name: Not on file   Number of children: Not on file   Years of education: Not on file   Highest education level: Not on file  Occupational History   Not on file  Tobacco Use   Smoking status: Never    Passive exposure: Yes   Smokeless tobacco: Never  Substance and Sexual Activity   Alcohol use: No   Drug use: Yes    Types: Marijuana    Comment: yesterday   Sexual activity: Yes  Other Topics Concern   Not on file  Social History Narrative   Lives at home with two brothers, mom and aunt,    Social Drivers of Corporate investment banker Strain: Not on File (05/29/2021)   Received from [employer] [facility], [employer]    Financial Resource Strain: 0  Food Insecurity: Not on File (11/05/2022)   Received from [facility] Insecurity    Food: 0  Transportation Needs: Not on File (05/29/2021)   Received from [employer] [facility], [employer] Needs    Transportation: 0  Physical Activity: Not on File (05/29/2021)   Received from [place], [place]   Physical Activity    Physical Activity: 0  Stress: Not on File (05/29/2021)   Received from [facility], [place]   Stress    Stress: 0  Social Connections: Not  on File (10/24/2022)   Received from [facility]   Social Connections    Connectedness: 0   Family History: Family History  Problem Relation Age of Onset   Diabetes Mother    Obesity Mother    Thyroid  disease Mother    Ovarian cysts Mother    Diabetes Maternal Grandmother    Hypertension Maternal Grandmother    Thyroid  disease Maternal Grandmother    Hypertension Maternal Grandfather    Allergies: Allergies  Allergen Reactions   Penicillins Hives   Medications Prior to Admission  Medication Sig Dispense Refill Last Dose/Taking   acetaminophen  (TYLENOL ) 500 MG tablet Take 1,000 mg by mouth every 6 (six) hours as needed for headache (pain).      albuterol (VENTOLIN HFA) 108 (90 Base) MCG/ACT inhaler Inhale 2 puffs into the lungs every 6 (six) hours as needed for wheezing  or shortness of breath.      aspirin  EC 81 MG tablet Take 1 tablet (81 mg total) by mouth daily. Start taking when you are [redacted] weeks pregnant for rest of pregnancy for prevention of preeclampsia 90 tablet 2    Blood Pressure Monitoring (BLOOD PRESSURE KIT) DEVI 1 kit by Does not apply route once a week. Check Blood Pressure regularly and record readings into the [employer] App.  Large Cuff.  DX O90.0 1 each 0    doxylamine , Sleep, (UNISOM ) 25 MG tablet Take 1 tablet (25 mg total) by mouth at bedtime. 30 tablet 2    famotidine  (PEPCID ) 20 MG tablet Take 1 tablet (20 mg total) by mouth daily. 30 tablet 3    loratadine (CLARITIN) 10 MG tablet Take 10 mg by mouth every evening.      Misc. Devices (GOJJI WEIGHT SCALE) MISC 1 Device by Does not apply route once a week. 1 each 0    ondansetron  (ZOFRAN -ODT) 4 MG disintegrating tablet Take 1 tablet (4 mg total) by mouth every 8 (eight) hours as needed for nausea. 12 tablet 0    Prenatal Vit-Fe Fumarate-FA (MULTIVITAMIN-PRENATAL) 27-0.8 MG TABS tablet Take 1 tablet by mouth daily at 12 noon.      promethazine  (PHENERGAN ) 12.5 MG tablet Take 1 tablet (12.5 mg total) by mouth every 6 (six) hours as needed for nausea or vomiting. 20 tablet 3    pyridOXINE (VITAMIN B6) 25 MG tablet Take 1 tablet (25 mg total) by mouth in the morning, at noon, and at bedtime. 90 tablet 2    Review of Systems  All systems reviewed and negative except as stated in HPI  Physical Exam BP (!) 157/83   Pulse (!) 47   Temp 98.2 F (36.8 C) (Oral)   Resp 16   Ht 5\' 3"  (1.6 m)   Wt 72.5 kg   LMP 06/03/2022   BMI 28.33 kg/m   Physical Exam Constitutional:      Appearance: Normal appearance.  HENT:     Head: Normocephalic and atraumatic.  Eyes:     Conjunctiva/sclera: Conjunctivae normal.  Cardiovascular:     Rate and Rhythm: Regular rhythm. Bradycardia present.     Pulses: Normal pulses.     Heart sounds: No murmur heard. Pulmonary:     Effort: Pulmonary effort is  normal.     Breath sounds: Normal breath sounds.  Abdominal:     General: Bowel sounds are normal.     Palpations: Abdomen is soft.     Tenderness: There is no abdominal tenderness.  Musculoskeletal:     Cervical back: Normal range  of motion.     Right lower leg: No edema.     Left lower leg: No edema.  Skin:    General: Skin is warm and dry.  Neurological:     General: No focal deficit present.     Mental Status: She is alert and oriented to person, place, and time.  Psychiatric:        Mood and Affect: Mood normal.        Behavior: Behavior normal.    Presentation: cephalic by US   Fetal monitoring: Baseline: 130 bpm, Variability: Good {> 6 bpm), Accelerations: Reactive, and Decelerations: Variable: mild Uterine activity: None  Dilation: 2.5 Effacement (%): 50 Station: -3 Presentation: Vertex Exam by:: Marney Sinning, RN  Prenatal labs: ABO, Rh: --/--/O POS (01/15 1007) Antibody: NEG (01/15 1007) Rubella: 2.06 (08/27 1036) RPR: Non Reactive (08/27 1036)  HBsAg: Negative (08/27 1036)  HIV: Non Reactive (08/27 1036)  GC/Chlamydia:  Neisseria Gonorrhea  Date Value Ref Range Status  02/06/2023 Negative  Final   Chlamydia  Date Value Ref Range Status  02/06/2023 Negative  Final   GBS: PRESUMPTIVE NEGATIVE/-- (12/28 1254)   Prenatal Transfer Tool  Maternal Diabetes: No Genetic Screening: Normal Maternal Ultrasounds/Referrals: IUGR Fetal Ultrasounds or other Referrals:  Referred to Materal Fetal Medicine  Maternal Substance Abuse:  Yes:  Type: Marijuana Significant Maternal Medications:  None Significant Maternal Lab Results: None  Results for orders placed or performed during the hospital encounter of 02/24/23 (from the past 24 hours)  CBC   Collection Time: 02/24/23 10:07 AM  Result Value Ref Range   WBC 10.0 4.0 - 10.5 K/uL   RBC 3.89 3.87 - 5.11 MIL/uL   Hemoglobin 12.5 12.0 - 15.0 g/dL   HCT 16.1 09.6 - 04.5 %   MCV 94.1 80.0 - 100.0 fL   MCH 32.1 26.0 -  34.0 pg   MCHC 34.2 30.0 - 36.0 g/dL   RDW 40.9 81.1 - 91.4 %   Platelets 266 150 - 400 K/uL   nRBC 0.0 0.0 - 0.2 %  Comprehensive metabolic panel   Collection Time: 02/24/23 10:07 AM  Result Value Ref Range   Sodium 137 135 - 145 mmol/L   Potassium 3.9 3.5 - 5.1 mmol/L   Chloride 109 98 - 111 mmol/L   CO2 20 (L) 22 - 32 mmol/L   Glucose, Bld 74 70 - 99 mg/dL   BUN 11 6 - 20 mg/dL   Creatinine, Ser 7.82 0.44 - 1.00 mg/dL   Calcium 8.5 (L) 8.9 - 10.3 mg/dL   Total Protein 6.6 6.5 - 8.1 g/dL   Albumin 2.7 (L) 3.5 - 5.0 g/dL   AST 40 15 - 41 U/L   ALT 26 0 - 44 U/L   Alkaline Phosphatase 209 (H) 38 - 126 U/L   Total Bilirubin 0.6 0.0 - 1.2 mg/dL   GFR, Estimated >95 >62 mL/min   Anion gap 8 5 - 15  Type and screen [facility]   Collection Time: 02/24/23 10:07 AM  Result Value Ref Range   ABO/RH(D) O POS    Antibody Screen NEG    Sample Expiration      02/27/2023,2359 Performed at [facility], [address], [place], [place] [address]    Assessment: Kelsey Jenkins is a 23 y.o. G1P0 at [redacted]w[redacted]d here for IOL  #Labor: admitted for IOL given IUGR #Pain: IV pain meds PRN, epidural upon request #FHT: Category II but overall reassuring with moderate variability and accels #  GBS/ID: Negative #MOF: breast feeding and bottle feeding #MOC:  unsure, thinks she doesn't want oral contraceptives but open to further contraceptive counseling  #Severe pre-E: hypertensive on admission, adherent to low dose aspirin  without red flag symptoms of Pre-E  Started on Mg and Pitocin , plan to consider starting Procardia  and Lasix  after L&D  Jaclynn Mast, Medical Student 02/24/2023, 11:24 AM

## 2023-02-25 ENCOUNTER — Inpatient Hospital Stay (HOSPITAL_COMMUNITY): Payer: Medicaid Other | Admitting: Anesthesiology

## 2023-02-25 ENCOUNTER — Encounter (HOSPITAL_COMMUNITY): Payer: Self-pay | Admitting: Family Medicine

## 2023-02-25 DIAGNOSIS — O411230 Chorioamnionitis, third trimester, not applicable or unspecified: Secondary | ICD-10-CM | POA: Diagnosis not present

## 2023-02-25 DIAGNOSIS — O365930 Maternal care for other known or suspected poor fetal growth, third trimester, not applicable or unspecified: Secondary | ICD-10-CM | POA: Diagnosis not present

## 2023-02-25 DIAGNOSIS — O43813 Placental infarction, third trimester: Secondary | ICD-10-CM | POA: Diagnosis not present

## 2023-02-25 DIAGNOSIS — Z3A38 38 weeks gestation of pregnancy: Secondary | ICD-10-CM | POA: Diagnosis not present

## 2023-02-25 DIAGNOSIS — Z3490 Encounter for supervision of normal pregnancy, unspecified, unspecified trimester: Secondary | ICD-10-CM | POA: Diagnosis not present

## 2023-02-25 DIAGNOSIS — O1414 Severe pre-eclampsia complicating childbirth: Secondary | ICD-10-CM | POA: Diagnosis not present

## 2023-02-25 DIAGNOSIS — O99324 Drug use complicating childbirth: Secondary | ICD-10-CM

## 2023-02-25 DIAGNOSIS — Z3A37 37 weeks gestation of pregnancy: Secondary | ICD-10-CM | POA: Diagnosis not present

## 2023-02-25 DIAGNOSIS — O43893 Other placental disorders, third trimester: Secondary | ICD-10-CM | POA: Diagnosis not present

## 2023-02-25 LAB — COMPREHENSIVE METABOLIC PANEL
ALT: 33 U/L (ref 0–44)
AST: 45 U/L — ABNORMAL HIGH (ref 15–41)
Albumin: 3 g/dL — ABNORMAL LOW (ref 3.5–5.0)
Alkaline Phosphatase: 234 U/L — ABNORMAL HIGH (ref 38–126)
Anion gap: 14 (ref 5–15)
BUN: 5 mg/dL — ABNORMAL LOW (ref 6–20)
CO2: 18 mmol/L — ABNORMAL LOW (ref 22–32)
Calcium: 7.4 mg/dL — ABNORMAL LOW (ref 8.9–10.3)
Chloride: 100 mmol/L (ref 98–111)
Creatinine, Ser: 0.77 mg/dL (ref 0.44–1.00)
GFR, Estimated: >60 mL/min (ref >60)
Glucose, Bld: 93 mg/dL (ref 70–99)
Potassium: 4.7 mmol/L (ref 3.5–5.1)
Sodium: 132 mmol/L — ABNORMAL LOW (ref 135–145)
Total Bilirubin: 0.2 mg/dL (ref 0.0–1.2)
Total Protein: 7.3 g/dL (ref 6.5–8.1)

## 2023-02-25 LAB — MAGNESIUM
Magnesium: 7.3 mg/dL (ref 1.7–2.4)
Magnesium: 6.9 mg/dL (ref 1.7–2.4)

## 2023-02-25 LAB — CBC WITH DIFFERENTIAL/PLATELET
Abs Immature Granulocytes: 0.07 10*3/uL (ref 0.00–0.07)
Basophils Absolute: 0 10*3/uL (ref 0.0–0.1)
Basophils Relative: 0 %
Eosinophils Absolute: 0 10*3/uL (ref 0.0–0.5)
Eosinophils Relative: 0 %
HCT: 36.7 % (ref 36.0–46.0)
Hemoglobin: 12.5 g/dL (ref 12.0–15.0)
Immature Granulocytes: 0 %
Lymphocytes Relative: 13 %
Lymphs Abs: 2 10*3/uL (ref 0.7–4.0)
MCH: 32 pg (ref 26.0–34.0)
MCHC: 34.1 g/dL (ref 30.0–36.0)
MCV: 93.9 fL (ref 80.0–100.0)
Monocytes Absolute: 1.3 10*3/uL — ABNORMAL HIGH (ref 0.1–1.0)
Monocytes Relative: 8 %
Neutro Abs: 12.2 10*3/uL — ABNORMAL HIGH (ref 1.7–7.7)
Neutrophils Relative %: 79 %
Platelets: 217 10*3/uL (ref 150–400)
RBC: 3.91 MIL/uL (ref 3.87–5.11)
RDW: 13.1 % (ref 11.5–15.5)
WBC: 15.6 10*3/uL — ABNORMAL HIGH (ref 4.0–10.5)
nRBC: 0 % (ref 0.0–0.2)

## 2023-02-25 LAB — CBC
HCT: 41.7 % (ref 36.0–46.0)
Hemoglobin: 14.2 g/dL (ref 12.0–15.0)
MCH: 32.2 pg (ref 26.0–34.0)
MCHC: 34.1 g/dL (ref 30.0–36.0)
MCV: 94.6 fL (ref 80.0–100.0)
Platelets: 242 10*3/uL (ref 150–400)
RBC: 4.41 MIL/uL (ref 3.87–5.11)
RDW: 13 % (ref 11.5–15.5)
WBC: 16.3 10*3/uL — ABNORMAL HIGH (ref 4.0–10.5)
nRBC: 0 % (ref 0.0–0.2)

## 2023-02-25 LAB — PROTEIN / CREATININE RATIO, URINE
Creatinine, Urine: 12 mg/dL
Total Protein, Urine: <6 mg/dL

## 2023-02-25 MED ORDER — ACETAMINOPHEN 10 MG/ML IV SOLN
1000.0000 mg | Freq: Four times a day (QID) | INTRAVENOUS | Status: DC
  Administered 2023-02-25: 1000 mg via INTRAVENOUS
  Filled 2023-02-25 (×3): qty 100

## 2023-02-25 MED ORDER — PRENATAL MULTIVITAMIN CH
1.0000 | ORAL_TABLET | Freq: Every day | ORAL | Status: DC
  Administered 2023-02-26: 1 via ORAL
  Filled 2023-02-25: qty 1

## 2023-02-25 MED ORDER — DIPHENHYDRAMINE HCL 25 MG PO CAPS: 25.0000 mg | ORAL_CAPSULE | Freq: Four times a day (QID) | ORAL | Status: DC | PRN

## 2023-02-25 MED ORDER — COCONUT OIL OIL
1.0000 | TOPICAL_OIL | Status: DC | PRN
  Administered 2023-02-26: 1 via TOPICAL

## 2023-02-25 MED ORDER — LIDOCAINE HCL (PF) 1 % IJ SOLN
INTRAMUSCULAR | Status: DC | PRN
  Administered 2023-02-25: 11 mL via EPIDURAL

## 2023-02-25 MED ORDER — ZOLPIDEM TARTRATE 5 MG PO TABS: 5.0000 mg | ORAL_TABLET | Freq: Every evening | ORAL | Status: DC | PRN

## 2023-02-25 MED ORDER — ONDANSETRON HCL 4 MG/2ML IJ SOLN: 4.0000 mg | INTRAMUSCULAR | Status: DC | PRN

## 2023-02-25 MED ORDER — TRANEXAMIC ACID-NACL 1000-0.7 MG/100ML-% IV SOLN: 1000.0000 mg | Freq: Once | INTRAVENOUS | Status: DC

## 2023-02-25 MED ORDER — TRANEXAMIC ACID-NACL 1000-0.7 MG/100ML-% IV SOLN
INTRAVENOUS | Status: AC
  Filled 2023-02-25: qty 100

## 2023-02-25 MED ORDER — LACTATED RINGERS IV SOLN
INTRAVENOUS | Status: DC
Start: 2023-02-25 — End: 2023-02-26
  Administered 2023-02-26: 75 mL/h via INTRAVENOUS

## 2023-02-25 MED ORDER — TETANUS-DIPHTH-ACELL PERTUSSIS 5-2.5-18.5 LF-MCG/0.5 IM SUSY: 0.5000 mL | PREFILLED_SYRINGE | Freq: Once | INTRAMUSCULAR | Status: DC

## 2023-02-25 MED ORDER — OXYCODONE HCL 5 MG PO TABS: 10.0000 mg | ORAL_TABLET | ORAL | Status: DC | PRN

## 2023-02-25 MED ORDER — SIMETHICONE 80 MG PO CHEW: 80.0000 mg | CHEWABLE_TABLET | ORAL | Status: DC | PRN

## 2023-02-25 MED ORDER — ACETAMINOPHEN 10 MG/ML IV SOLN
INTRAVENOUS | Status: AC
  Filled 2023-02-25: qty 100

## 2023-02-25 MED ORDER — OXYCODONE HCL 5 MG PO TABS: 5.0000 mg | ORAL_TABLET | ORAL | Status: DC | PRN

## 2023-02-25 MED ORDER — IBUPROFEN 600 MG PO TABS
600.0000 mg | ORAL_TABLET | Freq: Four times a day (QID) | ORAL | Status: DC
  Administered 2023-02-25 – 2023-02-27 (×7): 600 mg via ORAL
  Filled 2023-02-25 (×7): qty 1

## 2023-02-25 MED ORDER — ONDANSETRON HCL 4 MG PO TABS: 4.0000 mg | ORAL_TABLET | ORAL | Status: DC | PRN

## 2023-02-25 MED ORDER — SENNOSIDES-DOCUSATE SODIUM 8.6-50 MG PO TABS
2.0000 | ORAL_TABLET | Freq: Every day | ORAL | Status: DC
  Administered 2023-02-26 – 2023-02-27 (×2): 2 via ORAL
  Filled 2023-02-25 (×2): qty 2

## 2023-02-25 MED ORDER — ACETAMINOPHEN 325 MG PO TABS
650.0000 mg | ORAL_TABLET | ORAL | Status: DC | PRN
  Administered 2023-02-26 (×2): 650 mg via ORAL
  Filled 2023-02-25: qty 2

## 2023-02-25 MED ORDER — WITCH HAZEL-GLYCERIN EX PADS
1.0000 | MEDICATED_PAD | CUTANEOUS | Status: DC | PRN
Start: 2023-02-25 — End: 2023-02-27

## 2023-02-25 MED ORDER — BENZOCAINE-MENTHOL 20-0.5 % EX AERO
1.0000 | INHALATION_SPRAY | CUTANEOUS | Status: DC | PRN
  Administered 2023-02-25: 1 via TOPICAL
  Filled 2023-02-25: qty 56

## 2023-02-25 MED ORDER — LACTATED RINGERS IV SOLN
500.0000 mL | INTRAVENOUS | Status: AC | PRN
  Administered 2023-02-25: 250 mL via INTRAVENOUS

## 2023-02-25 MED ORDER — DIBUCAINE (PERIANAL) 1 % EX OINT: 1.0000 | TOPICAL_OINTMENT | CUTANEOUS | Status: DC | PRN

## 2023-02-25 NOTE — Progress Notes (Signed)
Called to bedside to assess FHR -- FHR in the 90s, sustaining. Pitocin stopped, IV bolus going, repositioning attempted, Terbutaline given. FHR improved w measures into the 110s (around baseline). Cx checked -- unchanged from previous. Continuing to watch FHR -- will consider resumption of Pit after allowing for some fetal recovery.  Sundra Aland, MD OB Fellow, Faculty Practice [facility], Center for [facility]

## 2023-02-25 NOTE — Anesthesia Procedure Notes (Signed)
Epidural Patient location during procedure: OB Start time: 02/25/2023 1:44 AM End time: 02/25/2023 2:01 AM  Staffing Anesthesiologist: Lowella Curb, MD Performed: anesthesiologist   Preanesthetic Checklist Completed: patient identified, IV checked, site marked, risks and benefits discussed, surgical consent, monitors and equipment checked, pre-op evaluation and timeout performed  Epidural Patient position: sitting Prep: ChloraPrep Patient monitoring: heart rate, cardiac monitor, continuous pulse ox and blood pressure Approach: midline Location: L2-L3 Injection technique: LOR air  Needle:  Needle type: Tuohy  Needle gauge: 17 G Needle length: 9 cm Needle insertion depth: 5 cm Catheter type: closed end flexible Catheter size: 20 Guage Catheter at skin depth: 9 cm Test dose: negative  Assessment Events: blood not aspirated, injection not painful, no injection resistance, no paresthesia and negative IV test  Additional Notes Reason for block:procedure for pain

## 2023-02-25 NOTE — Progress Notes (Signed)
Kelsey Jenkins is a 23 y.o. G1P0 at [redacted]w[redacted]d by LMP admitted for induction of labor due to Pre-eclamptic toxemia of pregnancy. and Poor fetal growth.  Subjective: Patient resting well. Family at bedside and supportive.   Objective: BP 134/67   Pulse 60   Temp 97.9 F (36.6 C) (Oral)   Resp 16   Ht 5\' 3"  (1.6 m)   Wt 72.5 kg   LMP 06/03/2022   SpO2 100%   BMI 28.33 kg/m  I/O last 3 completed shifts: In: 1438.5 [P.O.:220; I.V.:1218.5] Out: 3350 [Urine:3150; Emesis/NG output:200] Total I/O In: 1356.7 [P.O.:280; I.V.:1076.7] Out: 1375 [Urine:1375]  FHT:  FHR: 115 bpm, variability: minimal/Moderate  ,  accelerations: present,  decelerations: Lates and variables that resolved with position changes and decreasing Pit to 8 mil/u.  UC:   irregular, every 3-5 minutes SVE:   Dilation: 6 Effacement (%): 80 Station: -1, 0 Exam by:: Chelsea, SNM  Labs: Lab Results  Component Value Date   WBC 16.3 (H) 02/25/2023   HGB 14.2 02/25/2023   HCT 41.7 02/25/2023   MCV 94.6 02/25/2023   PLT 242 02/25/2023   CNM and SNM to patient bedside to discuss IUPC re-placement. Reviewed risks and benefits with patient and Family at bedside. Patient desires to proceed with IUPC. FHT Cat II prior to placing. SVE 6/80/-1/0 C. Cheree Ditto SNM. Fetal head well applied to cervix, IUPC placement successful on first attempt. No fluid return, no blood return.  Patient and fetus tolerated well and FHT remained Cat II following placement.    Assessment / Plan: Induction of labor due to preeclampsia and IUGR,  minimal progression, however only on 25mil/u of pit.   Labor:  Continue to titrate up as tolerated.  Preeclampsia:  on magnesium sulfate and no signs or symptoms of toxicity Fetal Wellbeing:  Category II Pain Control:  Epidural I/D:   GBS negative, and patient continues to remain afebrile.  Anticipated MOD:  NSVD  Claudette Head, CNM 02/25/2023, 10:51 AM

## 2023-02-25 NOTE — Progress Notes (Signed)
Kelsey Jenkins is a 23 y.o. G1P0 at [redacted]w[redacted]d by LMP admitted for induction of labor due to Pre-eclamptic toxemia of pregnancy. and Poor fetal growth.  Subjective: Patient starting to have a headache, nausea and vomiting.   Objective: BP (!) 132/92   Pulse 61   Temp 98 F (36.7 C) (Oral)   Resp 17   Ht 5\' 3"  (1.6 m)   Wt 72.5 kg   LMP 06/03/2022   SpO2 97%   BMI 28.33 kg/m  I/O last 3 completed shifts: In: 1438.5 [P.O.:220; I.V.:1218.5] Out: 3350 [Urine:3150; Emesis/NG output:200] Total I/O In: 1663.5 [P.O.:355; I.V.:1308.5] Out: 2375 [Urine:2125; Emesis/NG output:250]  FHT:  FHR: 110 bpm, variability: moderate,  accelerations:  Abscent,  decelerations:  Present occasional variables and lates that resolve with position changes  UC:   irregular, every 7-10 minutes SVE:   Dilation: 6 Effacement (%): 80 Station: -1, 0 Exam by:: alexandria ament  Labs: Lab Results  Component Value Date   WBC 15.6 (H) 02/25/2023   HGB 12.5 02/25/2023   HCT 36.7 02/25/2023   MCV 93.9 02/25/2023   PLT 217 02/25/2023   Patient Vitals for the past 24 hrs:  BP Temp Temp src Pulse Resp SpO2  02/25/23 1230 (!) 132/92 -- -- 61 -- 97 %  02/25/23 1200 (!) 153/103 -- -- 80 17 100 %  02/25/23 1141 -- 98 F (36.7 C) Oral -- -- --  02/25/23 1130 125/88 -- -- 64 -- 100 %  02/25/23 1100 99/61 -- -- 77 16 100 %  02/25/23 1032 121/77 -- -- 61 -- 99 %  02/25/23 1030 121/77 -- -- 61 -- 99 %  02/25/23 1000 134/67 -- -- 60 16 100 %  02/25/23 0935 -- 97.9 F (36.6 C) Oral -- -- --  02/25/23 0900 104/73 -- -- 71 17 99 %  02/25/23 0830 110/71 -- -- 62 -- 99 %  02/25/23 0800 127/77 -- -- 60 18 100 %  02/25/23 0734 -- -- -- -- -- 98 %  02/25/23 0731 124/75 (!) 97.3 F (36.3 C) Oral 69 -- --  02/25/23 0730 -- (!) 97.5 F (36.4 C) Axillary -- -- --  02/25/23 0700 126/89 -- -- 73 -- 97 %  02/25/23 0630 (!) 140/90 -- -- 64 18 99 %  02/25/23 0625 -- (!) 97.5 F (36.4 C) Oral -- -- 99 %  02/25/23 0600 (!)  142/90 -- -- 62 -- 100 %  02/25/23 0539 (!) 125/59 -- -- 82 -- 99 %  02/25/23 0507 -- (!) 97.4 F (36.3 C) Oral -- -- 99 %  02/25/23 0500 100/63 -- -- 63 -- 99 %  02/25/23 0430 104/62 -- -- 64 -- 99 %  02/25/23 0400 115/89 -- -- 78 -- 98 %  02/25/23 0330 118/77 97.7 F (36.5 C) Axillary (!) 57 -- 96 %  02/25/23 0300 113/80 -- -- (!) 52 -- 97 %  02/25/23 0235 (!) 154/84 -- -- (!) 53 -- 99 %  02/25/23 0230 (!) 153/87 -- -- (!) 54 -- 95 %  02/25/23 0220 132/81 -- -- (!) 59 -- 99 %  02/25/23 0215 137/85 -- -- (!) 57 -- 99 %  02/25/23 0210 (!) 139/90 -- -- (!) 53 -- 99 %  02/25/23 0205 (!) 151/83 -- -- (!) 53 -- 96 %  02/25/23 0200 120/82 (!) 97.4 F (36.3 C) Oral 61 -- 100 %  02/25/23 0155 129/79 -- -- 60 -- 100 %  02/25/23 0130 112/61 -- -- (!) 53 --  100 %  02/25/23 0100 123/78 -- -- (!) 53 -- 98 %  02/25/23 0030 (!) 131/90 -- -- (!) 46 17 99 %  02/25/23 0000 (!) 141/71 -- -- 61 -- 100 %  02/24/23 2350 (!) 151/82 -- -- (!) 56 -- 98 %  02/24/23 2230 (!) 155/97 -- -- (!) 58 -- 98 %  02/24/23 2217 (!) 154/89 -- -- (!) 49 -- --  02/24/23 2200 -- 97.7 F (36.5 C) Oral -- -- --  02/24/23 2103 (!) 135/96 -- -- (!) 54 -- 99 %  02/24/23 2030 (!) 127/94 -- -- 60 -- 100 %  02/24/23 2003 (!) 131/92 97.7 F (36.5 C) Oral (!) 58 -- 97 %  02/24/23 1918 114/78 97.7 F (36.5 C) Oral (!) 54 17 --  02/24/23 1900 109/76 -- -- 60 15 --  02/24/23 1830 129/69 -- -- (!) 54 -- --  02/24/23 1805 123/81 -- -- (!) 58 -- --  02/24/23 1800 -- -- -- -- 16 --  02/24/23 1730 107/70 -- -- (!) 58 -- --  02/24/23 1700 104/65 -- -- (!) 53 16 --  02/24/23 1630 119/72 -- -- (!) 49 -- --  02/24/23 1600 114/60 -- -- (!) 51 15 --  02/24/23 1530 108/61 -- -- (!) 51 -- --  02/24/23 1500 99/70 -- -- (!) 55 17 --  02/24/23 1430 110/77 -- -- 60 -- --  02/24/23 1400 134/65 -- -- 76 16 --  02/24/23 1330 137/74 -- -- (!) 47 -- --    Assessment / Plan: Induction of labor due to preeclampsia and IUGR,  progressing well  on pitocin s/p AROM.   Labor:  Continue to titrate pit up as tolerated  Preeclampsia:  on magnesium sulfate and patient reports increased drowsiness. Repeat PreE labs drawn as well as a Mag level.  Fetal Wellbeing:  Category II Pain Control:  Epidural I/D:   GBS negative  Anticipated MOD:  NSVD  Claudette Head, CNM 02/25/2023, 1:20 PM

## 2023-02-25 NOTE — Anesthesia Preprocedure Evaluation (Signed)
Anesthesia Evaluation  Patient identified by MRN, date of birth, ID band Patient awake    Reviewed: Allergy & Precautions, H&P , NPO status , Patient's Chart, lab work & pertinent test results  Airway Mallampati: II  TM Distance: >3 FB Neck ROM: Full    Dental no notable dental hx.    Pulmonary asthma    Pulmonary exam normal breath sounds clear to auscultation       Cardiovascular negative cardio ROS Normal cardiovascular exam Rhythm:Regular Rate:Normal     Neuro/Psych negative neurological ROS  negative psych ROS   GI/Hepatic negative GI ROS, Neg liver ROS,,,  Endo/Other  diabetesHypothyroidism    Renal/GU negative Renal ROS  negative genitourinary   Musculoskeletal negative musculoskeletal ROS (+)    Abdominal   Peds negative pediatric ROS (+)  Hematology negative hematology ROS (+)   Anesthesia Other Findings   Reproductive/Obstetrics (+) Pregnancy                             Anesthesia Physical Anesthesia Plan  ASA: 2  Anesthesia Plan: Epidural   Post-op Pain Management:    Induction:   PONV Risk Score and Plan:   Airway Management Planned:   Additional Equipment:   Intra-op Plan:   Post-operative Plan:   Informed Consent:   Plan Discussed with:   Anesthesia Plan Comments:        Anesthesia Quick Evaluation

## 2023-02-25 NOTE — Discharge Summary (Signed)
Postpartum Discharge Summary  Date of Service updated     Patient Name: Kelsey Jenkins DOB: 25-Sep-2000 MRN: [id number]  Date of admission: 02/24/2023 Delivery date:02/25/2023 Delivering provider: Carlynn Herald Date of discharge: 02/27/2023  Admitting diagnosis: Encounter for induction of labor [Z34.90] Intrauterine pregnancy: [redacted]w[redacted]d     Secondary diagnosis:  Principal Problem:   Encounter for induction of labor  Additional problems:  Patient Active Problem List   Diagnosis Date Noted   Encounter for induction of labor 02/24/2023   Fetal growth restriction antepartum 12/08/2022   Supervision of normal first pregnancy 10/06/2022   Pre-diabetes 10/21/2012   Hypothyroidism, acquired, 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 yo G1P1001 at [redacted]w[redacted]d was admitted to the hospital 02/24/2023 for induction of labor.  Indication for induction: Preeclampsia and IUGR .  Patient had an labor course complicated by thick mec Membrane Rupture Time/Date: 7:45 PM,02/24/2023  Delivery Method:Vaginal, Spontaneous Operative Delivery:N/A Episiotomy:   Lacerations:  2nd degree;Perineal Details of delivery can be found in separate delivery note.  Patient had a postpartum course complicated by preeclampsia. Patient is discharged home 02/27/23.  Newborn Data: Birth date:02/25/2023 Birth time:3:09 PM Gender:Female Living status:Living Apgars:5 ,9  Weight:1470 g  Magnesium Sulfate received: Yes: Seizure prophylaxis BMZ received: No Rhophylac:N/A MMR:N/A T-DaP: n/a Flu: N/A RSV Vaccine received: No Transfusion:No  Immunizations received: There is no immunization history for the selected administration types on file for this patient.  Physical exam  Vitals:    02/26/23 1545 02/26/23 2039 02/27/23 0543 02/27/23 0754  BP: 121/69 115/82 111/69 132/79  Pulse: 60 66 (!) 51 (!) 52  Resp: 18 16 16 15   Temp:  98.3 F (36.8 C) 98.9 F (37.2 C) 98.7 F (37.1 C)  TempSrc:  Oral  Oral  SpO2: 100% 100% 99% 98%  Weight:      Height:       General: alert and cooperative Lochia: appropriate Uterine Fundus: firm Incision: N/A DVT Evaluation: No evidence of DVT seen on physical exam. Labs: Lab Results  Component Value Date   WBC 15.1 (H) 02/26/2023   HGB 10.8 (L) 02/26/2023   HCT 31.6 (L) 02/26/2023   MCV 93.8 02/26/2023   PLT 206 02/26/2023      Latest Ref Rng & Units 02/26/2023    4:13 AM  CMP  Glucose 70 - 99 mg/dL 89   BUN 6 - 20 mg/dL 6   Creatinine 4.09 - 8.11 mg/dL 9.14   Sodium 782 - 956 mmol/L 132   Potassium 3.5 - 5.1 mmol/L 3.8   Chloride 98 - 111 mmol/L 101   CO2 22 - 32 mmol/L 23   Calcium 8.9 - 10.3 mg/dL 6.3   Total Protein 6.5 - 8.1 g/dL 5.5   Total Bilirubin 0.0 - 1.2 mg/dL 0.3   Alkaline Phos 38 - 126 U/L 165   AST 15 - 41 U/L 26   ALT 0 - 44 U/L 23    Edinburgh Score:    02/25/2023    6:36 PM  Edinburgh Postnatal Depression Scale Screening Tool  I have been able to laugh and see the funny side of things. 0  I have looked forward with enjoyment to things. 0  I have blamed myself unnecessarily when things went wrong. 2  I have been anxious or worried for no good reason. 2  I have felt scared or panicky for no good reason. 2  Things have been getting on top of me. 2  I have been so unhappy that I have had difficulty sleeping. 2  I have felt sad or miserable. 1  I have been so unhappy that I have been crying. 0  The thought of harming myself has occurred to me. 0  Edinburgh Postnatal Depression Scale Total 11   No data recorded  After visit meds:  Allergies as of 02/27/2023       Reactions   Penicillins Hives        Medication List     STOP taking these medications    albuterol 108 (90 Base) MCG/ACT  inhaler Commonly known as: VENTOLIN HFA   aspirin EC 81 MG tablet   Blood Pressure Kit Devi   doxylamine (Sleep) 25 MG tablet Commonly known as: UNISOM   famotidine 20 MG tablet Commonly known as: Pepcid   Gojji Weight Scale Misc   loratadine 10 MG tablet Commonly known as: CLARITIN   multivitamin-prenatal 27-0.8 MG Tabs tablet   ondansetron 4 MG disintegrating tablet Commonly known as: ZOFRAN-ODT   promethazine 12.5 MG tablet Commonly known as: PHENERGAN   pyridOXINE 25 MG tablet Commonly known as: VITAMIN B6       TAKE these medications    acetaminophen 325 MG tablet Commonly known as: Tylenol Take 2 tablets (650 mg total) by mouth every 6 (six) hours as needed (for pain scale < 4). What changed:  medication strength how much to take reasons to take this   furosemide 20 MG tablet Commonly known as: LASIX Take 1 tablet (20 mg total) by mouth 2 (two) times daily for 5 days.   ibuprofen 600 MG tablet Commonly known as: ADVIL Take 1 tablet (600 mg total) by mouth every 6 (six) hours.   NIFEdipine 30 MG 24 hr tablet Commonly known as: ADALAT CC Take 1 tablet (30 mg total) by mouth daily.         Discharge home in stable condition Infant Feeding: Bottle and Breast Infant Disposition:NICU Discharge instruction: per After Visit Summary and Postpartum booklet. Activity: Advance as tolerated. Pelvic rest for 6 weeks.  Diet: routine diet Future Appointments: Future Appointments  Date Time Provider Department Center  03/01/2023 10:15 AM [facility] HEALTH CLINICIAN [facility]  03/05/2023 10:40 AM [facility] NURSE [facility] None  04/09/2023 10:10 AM Ralene Muskrat, PA-C [facility] None   Follow up Visit:  Follow-up Information     [facility]. Go in 1 week(s).   Specialty: Obstetrics and Gynecology Why: Follow up in one week for a BP check Contact information: [address], [address] [place]  [place] [address] [phone]                 Please schedule this patient for a In person postpartum visit in 4 weeks with the following provider: Any provider. Additional Postpartum F/U:BP check 1 week  High risk pregnancy complicated by: HTN and Severe IUGR Delivery mode:  Vaginal, Spontaneous Anticipated Birth Control:   none  Message sent on 02/25/2023 by S. Suzie Portela CNM   02/27/2023 Sharon Seller, DO

## 2023-02-26 LAB — CBC
HCT: 31.6 % — ABNORMAL LOW (ref 36.0–46.0)
Hemoglobin: 10.8 g/dL — ABNORMAL LOW (ref 12.0–15.0)
MCH: 32 pg (ref 26.0–34.0)
MCHC: 34.2 g/dL (ref 30.0–36.0)
MCV: 93.8 fL (ref 80.0–100.0)
Platelets: 206 10*3/uL (ref 150–400)
RBC: 3.37 MIL/uL — ABNORMAL LOW (ref 3.87–5.11)
RDW: 13.3 % (ref 11.5–15.5)
WBC: 15.1 10*3/uL — ABNORMAL HIGH (ref 4.0–10.5)
nRBC: 0 % (ref 0.0–0.2)

## 2023-02-26 LAB — COMPREHENSIVE METABOLIC PANEL
ALT: 23 U/L (ref 0–44)
AST: 26 U/L (ref 15–41)
Albumin: 2.3 g/dL — ABNORMAL LOW (ref 3.5–5.0)
Alkaline Phosphatase: 165 U/L — ABNORMAL HIGH (ref 38–126)
Anion gap: 8 (ref 5–15)
BUN: 6 mg/dL (ref 6–20)
CO2: 23 mmol/L (ref 22–32)
Calcium: 6.3 mg/dL — CL (ref 8.9–10.3)
Chloride: 101 mmol/L (ref 98–111)
Creatinine, Ser: 0.8 mg/dL (ref 0.44–1.00)
GFR, Estimated: >60 mL/min (ref >60)
Glucose, Bld: 89 mg/dL (ref 70–99)
Potassium: 3.8 mmol/L (ref 3.5–5.1)
Sodium: 132 mmol/L — ABNORMAL LOW (ref 135–145)
Total Bilirubin: 0.3 mg/dL (ref 0.0–1.2)
Total Protein: 5.5 g/dL — ABNORMAL LOW (ref 6.5–8.1)

## 2023-02-26 LAB — MAGNESIUM: Magnesium: 7.3 mg/dL (ref 1.7–2.4)

## 2023-02-26 MED ORDER — LACTATED RINGERS IV SOLN: INTRAVENOUS | Status: AC

## 2023-02-26 MED ORDER — FUROSEMIDE 20 MG PO TABS
20.0000 mg | ORAL_TABLET | Freq: Two times a day (BID) | ORAL | Status: DC
  Administered 2023-02-26 – 2023-02-27 (×3): 20 mg via ORAL
  Filled 2023-02-26 (×3): qty 1

## 2023-02-26 MED ORDER — NIFEDIPINE ER OSMOTIC RELEASE 30 MG PO TB24
30.0000 mg | ORAL_TABLET | Freq: Every day | ORAL | Status: DC
  Administered 2023-02-26 – 2023-02-27 (×2): 30 mg via ORAL
  Filled 2023-02-26 (×2): qty 1

## 2023-02-26 MED ORDER — MAGNESIUM SULFATE 40 GM/1000ML IV SOLN: 1.0000 g/h | INTRAVENOUS | Status: AC

## 2023-02-26 NOTE — Lactation Note (Signed)
This note was copied from a baby's chart.  NICU Lactation Consultation Note  Patient Name: Girl Aloura Stepper [id number] Date: 02/26/2023 Age:23 hours  Reason for consult: Initial assessment; Primapara; 1st time breastfeeding; NICU baby; Early term 37-38.6wks; Infant < 6lbs; Maternal endocrine disorder; Other (Comment) (GHTN on MgSO4 post partum) Type of Endocrine Disorder?: Thyroid (hypothyroid, acquired autoimmune) Diabetes  SUBJECTIVE  LC in to visit with P1 Mom of baby "[name]" in the NICU.  Induced labor due to severe IUGR.  Infant weighing 3 lbs 3.9 oz at birth.   Baby on trial ad lib feedings of donor breast milk by bottle and has PIV fluids. Mom states she does desire to breastfeed baby, aware of lactation support available and will ask for LC prn.  Talked about baby's size being a factor in her ability to transfer adequate volume at the breast, but as she grows, she will develop skills with maturity and growth.  Mom currently on magnesium sulfate for GHTN and feeling tired.   Mom does want to initiate pumping to support a full milk supply.  LC set up DEBP and assisted her with the first pumping, after showing Mom how to do breast massage and hand expression.  Colostrum drops easily expressed.  Mom expressed 3 ml of colostrum and placed in refrigerator.  Mom to take the colostrum to baby and ask for breast milk labels from baby's nurse.  RN messaged that Mom needs labels.  LC set up a washing and a drying bin and instructed and demonstrated how to disassemble and wash, rinse and air dry parts and place to dry in drying bin.  Mom knows to transport all the parts to baby's room upon her discharge from [facility].    Mom does not have a DEBP, but does have WIC.  [facility] referral sent and Mom aware of [facility] loaner pump available and DEBP in baby's room for her use.  OBJECTIVE Infant data: Mother's Current Feeding Choice: Breast Milk and Donor Milk  O2 Device: Room Air  Infant feeding  assessment IDFTS - Readiness: 3 IDFTS - Quality: 4   Maternal data: G1P1001 Vaginal, Spontaneous Has patient been taught Hand Expression?: Yes Hand Expression Comments: easy flow of colostrum with hand expression Significant Breast History:: ++ breast changes Current breast feeding challenges:: infant admitted to NICU Does the patient have breastfeeding experience prior to this delivery?: No Pumping frequency: Initiated double pumping at 19 hrs post partum, encouraged regular pumping every 2-3 hrs Pumped volume: 3 mL Flange Size: 21 Risk factor for low/delayed milk supply:: Infant separation, IUGR infant weighing 1470 gm at birth  [facility] Program: Yes WIC Referral Sent?: Yes What county?: [place]  ASSESSMENT Infant:  Feeding Status: Ad lib Feeding method: Bottle Nipple Type: Nfant Extra Slow Flow (gold)  Maternal: Milk volume: Normal  INTERVENTIONS/PLAN Interventions: Interventions: Breast feeding basics reviewed; Skin to skin; Breast massage; Hand express; DEBP; Support pillows; Education; [employer] brochure; CDC Guidelines for Breast Pump Cleaning Tools: Pump; Flanges; Bottle Pump Education: Setup, frequency, and cleaning; Milk Storage  Plan: 1- STS with baby as much as possible, watching for feeding cues 2-Massage breasts and hand express 3- Pump both breasts on initiation setting for 15 mins every 2-3 hrs, goal of 8X/24 hrs 4- ask for LC prn  Consult Status: NICU follow-up NICU Follow-up type: Verify onset of copious milk; Verify absence of engorgement; New admission follow up   Judee Clara 02/26/2023, 11:56 AM

## 2023-02-26 NOTE — Social Work (Signed)
CSW acknowledged consult, Edinburgh 11. MOB is currently receiving magnesium until 1544. CSW will attempt to meet with MOB at a later time.   Vivi Barrack, MSW, LCSW Clinical Social Worker  [phone] 02/26/2023  8:42 AM

## 2023-02-26 NOTE — Progress Notes (Signed)
Post Partum Day 1 on magnesium for Pre E SF Subjective: No complaints except the magensium effects  Objective: Blood pressure 126/76, pulse (!) 53, temperature 98.6 F (37 C), temperature source Oral, resp. rate 18, height 5\' 3"  (1.6 m), weight 72.5 kg, last menstrual period 06/03/2022, SpO2 100%, unknown if currently breastfeeding.  Physical Exam:  General: alert, cooperative, and no distress Lochia: appropriate Uterine Fundus: firm Incision:  DVT Evaluation: No evidence of DVT seen on physical exam.  Recent Labs    02/25/23 1254 02/26/23 0413  HGB 12.5 10.8*  HCT 36.7 31.6*    Assessment/Plan: Mag off 1600, decrease to 1 mg/hr until then Procardia xl 30 + lasix 20 BID started Anticipate discharge tomorrow   LOS: 2 days   Lazaro Arms, MD 02/26/2023, 7:35 AM

## 2023-02-27 ENCOUNTER — Other Ambulatory Visit (HOSPITAL_COMMUNITY): Payer: Self-pay

## 2023-02-27 MED ORDER — ACETAMINOPHEN 325 MG PO TABS
650.0000 mg | ORAL_TABLET | Freq: Four times a day (QID) | ORAL | 0 refills | Status: AC | PRN
  Filled 2023-02-27: qty 30, 4d supply, fill #0

## 2023-02-27 MED ORDER — IBUPROFEN 600 MG PO TABS
600.0000 mg | ORAL_TABLET | Freq: Four times a day (QID) | ORAL | 0 refills | Status: AC
  Filled 2023-02-27: qty 30, 8d supply, fill #0

## 2023-02-27 MED ORDER — NIFEDIPINE ER 30 MG PO TB24
30.0000 mg | ORAL_TABLET | Freq: Every day | ORAL | 0 refills | Status: AC
  Filled 2023-02-27: qty 30, 30d supply, fill #0

## 2023-02-27 MED ORDER — ORAL CARE MOUTH RINSE: 15.0000 mL | OROMUCOSAL | Status: DC | PRN

## 2023-02-27 MED ORDER — FUROSEMIDE 20 MG PO TABS
20.0000 mg | ORAL_TABLET | Freq: Two times a day (BID) | ORAL | 0 refills | Status: AC
  Filled 2023-02-27: qty 5, 3d supply, fill #0

## 2023-02-27 NOTE — Plan of Care (Signed)
  Problem: Education: Goal: Knowledge of General Education information will improve Description: Including pain rating scale, medication(s)/side effects and non-pharmacologic comfort measures Outcome: Progressing   Problem: Health Behavior/Discharge Planning: Goal: Ability to manage health-related needs will improve Outcome: Progressing   Problem: Clinical Measurements: Goal: Ability to maintain clinical measurements within normal limits will improve Outcome: Progressing Goal: Will remain free from infection Outcome: Progressing Goal: Diagnostic test results will improve Outcome: Progressing   Problem: Activity: Goal: Risk for activity intolerance will decrease Outcome: Progressing   Problem: Coping: Goal: Level of anxiety will decrease Outcome: Progressing   Problem: Elimination: Goal: Will not experience complications related to bowel motility Outcome: Progressing Goal: Will not experience complications related to urinary retention Outcome: Progressing   Problem: Pain Managment: Goal: General experience of comfort will improve and/or be controlled Outcome: Progressing   Problem: Safety: Goal: Ability to remain free from injury will improve Outcome: Progressing   Problem: Skin Integrity: Goal: Risk for impaired skin integrity will decrease Outcome: Progressing   Problem: Education: Goal: Knowledge of disease or condition will improve Outcome: Progressing Goal: Knowledge of the prescribed therapeutic regimen will improve Outcome: Progressing   Problem: Clinical Measurements: Goal: Complications related to disease process, condition or treatment will be avoided or minimized Outcome: Progressing   Problem: Education: Goal: Knowledge of condition will improve Outcome: Progressing   Problem: Activity: Goal: Ability to tolerate increased activity will improve Outcome: Progressing   Problem: Coping: Goal: Ability to identify and utilize available resources and  services will improve Outcome: Progressing   Problem: Life Cycle: Goal: Chance of risk for complications during the postpartum period will decrease Outcome: Progressing

## 2023-02-27 NOTE — Clinical Social Work Maternal (Addendum)
CLINICAL SOCIAL WORK MATERNAL/CHILD NOTE  Patient Details  Name: Kelsey Jenkins MRN: [id number] Date of Birth: September 26, 2000  Date:  02/27/2023  Clinical Social Worker Initiating Note:  Albertine Patricia, LCSWA Date/Time: Initiated:  02/27/23/1609     Child's Name:  Kelsey Jenkins   Biological Parents:  Mother, Father (FOB: Clarice Pole (unknown last name))   Need for Interpreter:  None   Reason for Referral:  Behavioral Health Concerns, Current Substance Use/Substance Use During Pregnancy   (NICU admission)   Address:  [address] [place] [address]    Phone number:  [phone] (home)     Additional phone number:   Household Members/Support Persons (HM/SP):   Household Member/Support Person 1, Household Member/Support Person 2   HM/SP Name Relationship DOB or Age  HM/SP -1 Wyline Copas Mother 09/27/1975  HM/SP -2 Lorayne Bender Brother 24  HM/SP -3        HM/SP -4        HM/SP -5        HM/SP -6        HM/SP -7        HM/SP -8          Natural Supports (not living in the home):  Other (Comment), Friends (FOB)   Professional Supports: None   Employment: Consulting civil engineer   Type of Work:     Education:  Teaching laboratory technician (Enrolled in Proofreader program)   Homebound arranged:    Surveyor, quantity Resources:  Medicaid   Other Resources:  Sales executive  , WIC   Cultural/Religious Considerations Which May Impact Care:  None identified  Strengths:  Ability to meet basic needs  , Home prepared for child     Psychotropic Medications:         Pediatrician:     Undecided, list provided  Pediatrician List:   [facility]      Pediatrician Fax Number:    Risk Factors/Current Problems:  Mental Health Concerns  , Substance Use     Cognitive State:  Linear Thinking  , Able to Concentrate  , Alert  , Goal Oriented     Mood/Affect:  Bright  , Calm  , Comfortable  , Interested      CSW Assessment: CSW was consulted due to NICU admission, marijuana use during pregnancy, and Edinburgh Postnatal Depression Scale score of 11. CSW met with MOB at bedside to complete assessment. When CSW entered room, MOB's mother was standing at the door, exiting the room. MOB was observed sitting in hospital bed. CSW introduced self and requested to speak with MOB alone. MOB provided verbal consent for her mother to be present who left the room shortly after CSW began assessment. CSW explained reasons for consult. MOB presented as calm, was agreeable to consult and remained engaged throughout encounter.   CSW confirmed MOB's address and phone number listed in chart. MOB reports she lives with her mother and brother who are both excited about infant's arrival and supportive. MOB reports that she and FOB are not together but he is supportive and plans to be involved in infant's care. MOB states that she plans to have FOB sign infant's birth certificate but he has not been able to come to the hospital yet due to working 3rd shift. MOB also identified friends as supports. MOB reports she is enrolled in an online nursing  program and will return to attend classes in person beginning in March.   CSW inquired how MOB is feeling. MOB shared that she is doing "good," sharing that she was able to visit with infant in the NICU this morning and held "[name]" for 2 hours. CSW reviewed MOB's edinburgh responses. MOB shares that she has felt concern about postpartum depression but shares that so far she has not endorsed feelings of sadness or excessive worry since infant's arrival. CSW reviewed warning signs of postpartum depression and anxiety and encouraged MOB to prioritize self care and sleep while adjusting to parenthood. MOB denied a history of mental health diagnoses. MOB met with integrated behavioral health therapist Asher Muir through her OBGYN clinic 12/31/22 during her pregnancy. MOB reports she feels comfortable  sending her OBGYN clinic a MyChart message if she is in need of additional mental health support. MOB was agreeable to additional outpatient mental health resources, which CSW provided. MOB denied current SI/HI/DV.  CSW provided education regarding the baby blues period vs. perinatal mood disorders, discussed treatment and gave resources for mental health follow up if concerns arise.  CSW recommends self-evaluation during the postpartum time period using the New Mom Checklist from Postpartum Progress and encouraged MOB to contact a medical professional if symptoms are noted at any time.    MOB reports she has a car seat and bassinet for infant but is in need of a breast pump, preemie clothing, bottles, wipes, and preemie diapers. Lactation has met with mom and reviewed breast pump support via [facility]. CSW explained that a referral can be made to [employer] within the [employer]. MOB expressed appreciation and requested a referral. CSW placed a referral for requested items. MOB reports she has not yet chosen a pediatrician, CSW provided a list.   CSW explained that infant is eligible to apply for SSI benefits and provided instructions on how to apply.   CSW and MOB discussed infant's NICU admission. CSW informed MOB about the NICU, what to expect, and resources/supports available while infant is admitted to the NICU. MOB reported that she feels updated about infant's care. MOB denied any transportation barriers with visiting infant in the NICU. MOB reported that she plans to room with infant and stated that meal vouchers would be helpful. CSW provided MOB with 8 meal vouchers, which were placed at infant's bedside. MOB denied additional resource needs at this time.  CSW informed MOB about hospital drug screen policy due to documented use of marijuana during pregnancy. CSW explained that infant's UDS was not collected; however infant's CDS would be monitored and a CPS report would be made if  warranted. MOB expressed understanding. CSW inquired about substance use/barriers to prenatal care during pregnancy. MOB reports that she did find out that she was pregnant until she was 5-6 months along. MOB reports that she has always smoked marijuana and reports that once discovering she was pregnant, she decreased her marijuana use and smoked on average 3 times per day to help her have an appetite. MOB reports the last time she smoked marijuana was about 2 weeks ago. MOB denied using other illicit substances during pregnancy. MOB reports that her prenatal care appointment was transferred from [facility] to [facility] after she was diagnosed with fetal growth restriction. MOB reports that she attended all scheduled prenatal care appointments.n  CSW provided review of Sudden Infant Death Syndrome (SIDS) precautions.    CSW will continue to offer support and resources to family while infant remains in NICU.  CSW Plan/Description:  Sudden Infant Death Syndrome (SIDS) Education, Perinatal Mood and Anxiety Disorder (PMADs) Education, Special educational needs teacher Income (SSI) Information, Hospital Drug Screen Policy Information, Other Information/Referral to [facility], CSW Will Continue to Monitor Umbilical Cord Tissue Drug Screen Results and Make Report if Warranted, Psychosocial Support and Ongoing Assessment of Needs    Norberto Sorenson, LCSWA 02/27/2023, 4:46 PM

## 2023-02-27 NOTE — Anesthesia Postprocedure Evaluation (Signed)
Anesthesia Post Note  Patient: Kelsey Jenkins  Procedure(s) Performed: AN AD HOC LABOR EPIDURAL     Patient location during evaluation: Mother Baby Anesthesia Type: Epidural Level of consciousness: awake and alert Pain management: pain level controlled Vital Signs Assessment: post-procedure vital signs reviewed and stable Respiratory status: spontaneous breathing, nonlabored ventilation and respiratory function stable Cardiovascular status: stable Postop Assessment: no headache, no backache, epidural receding and able to ambulate Anesthetic complications: no   No notable events documented.  Last Vitals:  Vitals:   02/27/23 0543 02/27/23 0754  BP: 111/69 132/79  Pulse: (!) 51 (!) 52  Resp: 16 15  Temp: 37.2 C 37.1 C  SpO2: 99% 98%    Last Pain:  Vitals:   02/27/23 0754  TempSrc: Oral  PainSc:    Pain Goal: Patients Stated Pain Goal: 2 (02/26/23 1150)                 Marquies Wanat

## 2023-02-28 ENCOUNTER — Ambulatory Visit (HOSPITAL_COMMUNITY): Payer: Self-pay

## 2023-02-28 NOTE — Lactation Note (Signed)
This note was copied from a baby's chart.  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  Mom was shown by RN how to make the handpump, but when she got home, she didn't have all the parts.  LC opened up another pump kit and set up the DEBP and assisted her to pump while holding "[name]" on her chest.  Mom's breasts are soft, milk noted to be dripping from nipples.  Mom states she hand expressed with GMOB's assistance overnight.  Mom provided with [facility] loaner paperwork, GMOB will be bringing in the $30 deposit.   Plan- 1- STS with baby as much as possible 2- Breast massage and hand express often 3- Pump both breasts 15-30 mins using maintain mode 4- ask for LC, WIC loaner prior to leaving NICU today   OBJECTIVE Infant data: No data recorded O2 Device: Room Air  Infant feeding assessment IDFTS - Readiness: 3 IDFTS - Quality: 4   Maternal data: G1P1001 Vaginal, Spontaneous Pumping frequency: Mom hasn't pumped in 24 hrs Pumped volume: 60 mL (and counting with current pumping) Flange Size: 21  WIC Program: Yes WIC Referral Sent?: Yes What county?: [place] Pump:  [facility] loaner paperwork provided)  ASSESSMENT Infant:  Feeding Status: Scheduled 9-12-3-6 Feeding method: Tube/Gavage (Bolus) Nipple Type: Nfant Extra Slow Flow (gold)  Maternal: Milk volume: Normal  INTERVENTIONS/PLAN Interventions: Discharge Education: Engorgement and breast care Tools: Pump; Flanges; Bottle; Hands-free pumping top Pump Education: Setup, frequency, and cleaning; Milk Storage  Plan: Consult Status: NICU  follow-up NICU Follow-up type: Verify onset of copious milk; Verify absence of engorgement; Verify DEBP issuance   Judee Clara 02/28/2023, 1:29 PM

## 2023-03-01 ENCOUNTER — Ambulatory Visit (HOSPITAL_COMMUNITY): Payer: Self-pay

## 2023-03-01 LAB — SURGICAL PATHOLOGY

## 2023-03-01 NOTE — Lactation Note (Signed)
This note was copied from a baby's chart.  NICU Lactation Consultation Note  Patient Name: Girl Tionna Steensma [id number] Date: 03/01/2023 Age:23 days  Reason for consult: Follow-up assessment; RN request; Primapara; 1st time breastfeeding; NICU baby; Early term 37-38.6wks; Infant < 6lbs; Maternal endocrine disorder Type of Endocrine Disorder?: Thyroid (hypothyroid)  SUBJECTIVE  LC in to visit with P1 Mom of baby "[name]".  Mom desires the [facility] loaner and brought her $30 deposit with her.  [facility] loaner provided with instructions on how to return.  Mom to use the maintain mode for 20-30 mins.  Mom provided with a cooler and ice pack to transport her EBM to hospital.  [facility] assisted with "[name]" going to a recently pumped breast.  Baby remained swaddled to help with centering baby.  Hand expressed drops of milk onto baby's lips, no cueing noted.  Teaching done with Mom on how to watch baby for cues that she is ready.  Reassured Mom that baby was small and not as strong as she will be.  Baby unswaddled and placed STS on Mom's chest where she fell asleep.  LC talked about watching baby for strong feeding cues before every trying baby at the breast.  LC also stated that due to baby's small size, Mom would need to pre-pump 20-30 mins prior to attempting at the breast again.    OBJECTIVE Infant data: No data recorded O2 Device: Room Air  Infant feeding assessment IDFTS - Readiness: 3 IDFTS - Quality: 4   Maternal data: G1P1001 Vaginal, Spontaneous Pumping frequency: inconsistent pumping.  Provided a [facility] loaner pump today and encouraged pumping every 3 hrs Pumped volume: 120 mL Flange Size: 21 Hands-free pumping top sizes: Small/Medium (Blue)  WIC Program: Yes WIC Referral Sent?: Yes What county?: [place] Pump: WIC Loaner  ASSESSMENT Infant: Latch: Too sleepy or reluctant, no latch achieved, no sucking elicited. Audible Swallowing: None Type of Nipple: Everted at rest and after  stimulation Comfort (Breast/Nipple): Soft / non-tender Hold (Positioning): Full assist, staff holds infant at breast LATCH Score: 4  Feeding Status: Scheduled 8-11-2-5 Feeding method: Breast Nipple Type: Nfant Extra Slow Flow (gold)  Maternal: Milk volume: Normal  INTERVENTIONS/PLAN Interventions: Interventions: Breast feeding basics reviewed; Skin to skin; Breast massage; Hand express; Expressed milk; Support pillows; Position options; Adjust position; DEBP; Education Discharge Education: Engorgement and breast care Tools: Pump; Flanges; Hands-free pumping top Pump Education: Setup, frequency, and cleaning; Milk Storage  Plan: 1- STS with baby 2- watch for cues, allow baby to "lick and learn" at a pumped breast 3- Pump both breasts 15-30 mins every 3 hrs  4- ask for [facility] assistance with postioning and latching to the breast  Consult Status: NICU follow-up NICU Follow-up type: Weekly NICU follow up   Judee Clara 03/01/2023, 2:32 PM

## 2023-03-04 NOTE — BH Specialist Note (Signed)
 Pt did not arrive to video visit and did not answer the phone; Left HIPPA-compliant message to call back Carolyn Cisco from [employer] for [employer] at [facility] at  [phone] [facility] office).  ?; left MyChart message for patient.  ? ?

## 2023-03-05 ENCOUNTER — Ambulatory Visit: Payer: Medicaid Other

## 2023-03-05 VITALS — BP 113/79 | HR 92 | Ht 63.0 in | Wt 145.0 lb

## 2023-03-05 DIAGNOSIS — Z0130 Encounter for examination of blood pressure without abnormal findings: Secondary | ICD-10-CM

## 2023-03-05 NOTE — Progress Notes (Cosign Needed Addendum)
Subjective:  Kelsey Jenkins is a 23 y.o. female here for BP check.   Hypertension ROS: taking medications as instructed, no medication side effects noted, no TIA's, no chest pain on exertion, no dyspnea on exertion, and no swelling of ankles.    Objective:  BP 113/79   Pulse 92   Ht 5\' 3"  (1.6 m)   Wt 145 lb (65.8 kg)   LMP 06/03/2022   Breastfeeding Yes   BMI 25.69 kg/m   Appearance alert, well appearing, and in no distress. General exam BP noted to be well controlled today in office.    Assessment:   Blood Pressure well controlled.   Plan:  Per MD, Current treatment plan is effective, no change in therapy.  Keep PP appt.

## 2023-03-10 ENCOUNTER — Ambulatory Visit (HOSPITAL_COMMUNITY): Payer: Self-pay

## 2023-03-10 NOTE — Lactation Note (Signed)
This note was copied from a baby's chart.  NICU Lactation Consultation Note  Patient Name: Kelsey Jenkins [id number] Date: 03/10/2023 Age:23 days  Reason for consult: Weekly NICU follow-up; Primapara; 1st time breastfeeding; NICU baby; Infant < 6lbs; Maternal endocrine disorder Type of Endocrine Disorder?: Thyroid (hypothyroid)  SUBJECTIVE Visited with family of 23 days old ETI NICU female; Ms. Frymire is a P1 and reported she's pumping consistently, she still has the loaner pump; her [facility] appt is on 03/12/2023. She's been mainly working on bottle feedings with baby "Kelsey Jenkins" while in the NICU but she plans on taking her to breast once she gets home. Baby "Kelsey Jenkins" is getting discharged today. Reviewed discharge education and the importance of consistent pumping for the prevention of engorgement and to protect her supply. Encouraged her to pump whenever baby is getting a bottle. She politely declined LC OP F/U and voiced she'll be following up with the [facility] office @ [facility] for lactation care. No other support person at this time. All questions and concerns answered, family to contact [facility] services PRN.  OBJECTIVE Infant data: Mother's Current Feeding Choice: Breast Milk  O2 Device: Room Air  Infant feeding assessment IDFTS - Readiness: 1 IDFTS - Quality: 2   Maternal data: G1P1001 Vaginal, Spontaneous Pumping frequency: 6 times/24 hours Pumped volume: 100 mL (100-110 ml)  WIC Program: Yes WIC Referral Sent?: Yes What county?: [place] Pump: WIC Loaner  ASSESSMENT Infant: Feeding Status: Ad lib Feeding method: Bottle Nipple Type: Dr. Levert Feinstein Preemie  Maternal: Milk volume: Low (borderline low)  INTERVENTIONS/PLAN Interventions: Interventions: Breast feeding basics reviewed; DEBP; Education; Coconut oil  Plan: Consult Status: Complete   Calla Wedekind S Ryane Konieczny 03/10/2023, 4:36 PM

## 2023-03-18 ENCOUNTER — Ambulatory Visit: Payer: Medicaid Other | Admitting: Clinical

## 2023-03-18 DIAGNOSIS — Z91199 Patient's noncompliance with other medical treatment and regimen due to unspecified reason: Secondary | ICD-10-CM

## 2023-04-02 NOTE — BH Specialist Note (Unsigned)
 Integrated Behavioral Health via Telemedicine Visit  04/06/2023 Kelsey Jenkins [id number]  Number of Integrated Behavioral Health Clinician visits: 2- Second Visit  Session Start time: 0822   Session End time: 0835  Total time in minutes: 13  Referring Provider: Sharen Counter, CNM Patient/Family location: Home [facility] Provider location: [facility]  All persons participating in visit: Patient Kelsey Jenkins and [facility] Kamy Poinsett   Types of Service: Individual psychotherapy and Telephone visit  I connected with Virl Diamond and/or Antoinette Gadea's  n/a  via  Telephone or Video Enabled Telemedicine Application  (Video is Caregility application) and verified that I am speaking with the correct person using two identifiers. Discussed confidentiality: Yes   I discussed the limitations of telemedicine and the availability of in person appointments.  Discussed there is a possibility of technology failure and discussed alternative modes of communication if that failure occurs.  I discussed that engaging in this telemedicine visit, they consent to the provision of behavioral healthcare and the services will be billed under their insurance.  Patient and/or legal guardian expressed understanding and consented to Telemedicine visit: Yes   Presenting Concerns: Patient and/or family reports the following symptoms/concerns: Increased symptoms of depression and anxiety, attributes to poor sleep and fatigue; sleeping up to three hours at a time; pt is coping with support from family; goal is to get back to classes in about a month.  Duration of problem: Postpartum; Severity of problem: moderate  Patient and/or Family's Strengths/Protective Factors: Social connections, Concrete supports in place (healthy food, safe environments, etc.), and Sense of purpose  Goals Addressed: Patient will:  Reduce symptoms of: anxiety and depression    Increase knowledge and/or ability of: healthy habits   Demonstrate ability to: Increase healthy adjustment to current life circumstances  Progress towards Goals: Ongoing  Interventions: Interventions utilized:  Motivational Interviewing Standardized Assessments completed: GAD-7 and PHQ 9  Patient and/or Family Response: Patient agrees with treatment plan.   Assessment: Patient currently experiencing Adjustment disorder with mixed anxious and depressed mood.   Patient may benefit from continued therapeutic intervention  .  Plan: Follow up with behavioral health clinician on : Call Devi Hopman at [phone], as needed. Behavioral recommendations:  -Begin prioritizing healthy self-care (regular meals, adequate rest; allowing practical help from supportive friends and family)  -Consider new mom support group as needed at either [email or web address].net or [email or web address]  -Read through information on After Visit Summary; use as needed and discussed  Referral(s): Integrated Art gallery manager (In Clinic) and [facility]:  childcare; new mom support  I discussed the assessment and treatment plan with the patient and/or parent/guardian. They were provided an opportunity to ask questions and all were answered. They agreed with the plan and demonstrated an understanding of the instructions.   They were advised to call back or seek an in-person evaluation if the symptoms worsen or if the condition fails to improve as anticipated.  [name], LCSW     04/06/2023    8:26 AM 12/31/2022    8:32 AM 10/06/2022    9:53 AM  Depression screen PHQ 2/9  Decreased Interest 1 2 0  Down, Depressed, Hopeless 1 1 2   PHQ - 2 Score 2 3 2   Altered sleeping 3 0 2  Tired, decreased energy 3 1 3   Change in appetite 1 0 3  Feeling bad or failure about yourself  1 0 0  Trouble concentrating 1 0 0  Moving slowly or fidgety/restless  0 0 0  Suicidal thoughts 0 0 0  PHQ-9 Score 11 4 10    Difficult doing work/chores   Not difficult at all      04/06/2023    8:28 AM 12/31/2022    8:35 AM 10/06/2022    9:54 AM  GAD 7 : Generalized Anxiety Score  Nervous, Anxious, on Edge 1 1 0  Control/stop worrying 1 0 0  Worry too much - different things 1 2 0  Trouble relaxing 1 0 2  Restless 0 0 0  Easily annoyed or irritable 1 1 3   Afraid - awful might happen 1 1 0  Total GAD 7 Score 6 5 5   Anxiety Difficulty   Not difficult at all

## 2023-04-06 ENCOUNTER — Ambulatory Visit: Payer: Medicaid Other | Admitting: Clinical

## 2023-04-06 DIAGNOSIS — F4323 Adjustment disorder with mixed anxiety and depressed mood: Secondary | ICD-10-CM

## 2023-04-06 NOTE — Patient Instructions (Signed)
 Center for [facility] at [facility] [address] [place], [place] [address] [phone] (main office) [phone] (Areonna Bran's office)  [place] Child Counsellor  (Childcare options, Early childcare development, etc.) [email or web address]  [employer] Child Care Facility Search Engine  [email or web address]  New Parent Support Groups [email or web address].net [email or web address]

## 2023-04-09 ENCOUNTER — Ambulatory Visit: Payer: Medicaid Other | Admitting: Physician Assistant

## 2023-05-24 DIAGNOSIS — Z111 Encounter for screening for respiratory tuberculosis: Secondary | ICD-10-CM | POA: Diagnosis not present

## 2023-06-01 DIAGNOSIS — J029 Acute pharyngitis, unspecified: Secondary | ICD-10-CM | POA: Diagnosis not present

## 2023-06-01 DIAGNOSIS — H9202 Otalgia, left ear: Secondary | ICD-10-CM | POA: Diagnosis not present

## 2023-06-01 DIAGNOSIS — R051 Acute cough: Secondary | ICD-10-CM | POA: Diagnosis not present

## 2023-06-01 DIAGNOSIS — R070 Pain in throat: Secondary | ICD-10-CM | POA: Diagnosis not present

## 2023-06-01 DIAGNOSIS — R0981 Nasal congestion: Secondary | ICD-10-CM | POA: Diagnosis not present

## 2023-08-27 DIAGNOSIS — N10 Acute tubulo-interstitial nephritis: Secondary | ICD-10-CM | POA: Diagnosis not present

## 2023-08-27 DIAGNOSIS — M549 Dorsalgia, unspecified: Secondary | ICD-10-CM | POA: Diagnosis not present

## 2023-08-27 DIAGNOSIS — R350 Frequency of micturition: Secondary | ICD-10-CM | POA: Diagnosis not present

## 2023-08-27 DIAGNOSIS — R300 Dysuria: Secondary | ICD-10-CM | POA: Diagnosis not present

## 2023-09-15 DIAGNOSIS — F418 Other specified anxiety disorders: Secondary | ICD-10-CM | POA: Diagnosis not present

## 2023-09-27 DIAGNOSIS — R070 Pain in throat: Secondary | ICD-10-CM | POA: Diagnosis not present

## 2023-09-27 DIAGNOSIS — U071 COVID-19: Secondary | ICD-10-CM | POA: Diagnosis not present

## 2023-09-27 DIAGNOSIS — J0300 Acute streptococcal tonsillitis, unspecified: Secondary | ICD-10-CM | POA: Diagnosis not present

## 2023-11-11 DIAGNOSIS — F418 Other specified anxiety disorders: Secondary | ICD-10-CM | POA: Diagnosis not present

## 2023-11-13 DIAGNOSIS — F418 Other specified anxiety disorders: Secondary | ICD-10-CM | POA: Diagnosis not present

## 2023-11-18 DIAGNOSIS — F418 Other specified anxiety disorders: Secondary | ICD-10-CM | POA: Diagnosis not present

## 2023-11-24 DIAGNOSIS — F418 Other specified anxiety disorders: Secondary | ICD-10-CM | POA: Diagnosis not present

## 2023-11-28 DIAGNOSIS — F418 Other specified anxiety disorders: Secondary | ICD-10-CM | POA: Diagnosis not present

## 2023-11-29 DIAGNOSIS — F418 Other specified anxiety disorders: Secondary | ICD-10-CM | POA: Diagnosis not present

## 2023-12-01 DIAGNOSIS — F418 Other specified anxiety disorders: Secondary | ICD-10-CM | POA: Diagnosis not present

## 2023-12-10 DIAGNOSIS — F418 Other specified anxiety disorders: Secondary | ICD-10-CM | POA: Diagnosis not present

## 2023-12-13 DIAGNOSIS — F418 Other specified anxiety disorders: Secondary | ICD-10-CM | POA: Diagnosis not present

## 2023-12-16 DIAGNOSIS — F418 Other specified anxiety disorders: Secondary | ICD-10-CM | POA: Diagnosis not present

## 2023-12-21 DIAGNOSIS — N898 Other specified noninflammatory disorders of vagina: Secondary | ICD-10-CM | POA: Diagnosis not present

## 2023-12-23 DIAGNOSIS — F418 Other specified anxiety disorders: Secondary | ICD-10-CM | POA: Diagnosis not present

## 2023-12-30 DIAGNOSIS — F418 Other specified anxiety disorders: Secondary | ICD-10-CM | POA: Diagnosis not present

## 2024-01-01 DIAGNOSIS — F418 Other specified anxiety disorders: Secondary | ICD-10-CM | POA: Diagnosis not present
